# Patient Record
Sex: Male | Born: 1965 | Race: White | Marital: Married | Smoking: Never smoker
Health system: Southern US, Community
[De-identification: ages and names within clinical notes are randomized; demographics above are authoritative.]

## PROBLEM LIST (undated history)

## (undated) DIAGNOSIS — M109 Gout, unspecified: Secondary | ICD-10-CM

## (undated) HISTORY — DX: Gout, unspecified: M10.9

---

## 2012-05-25 HISTORY — PX: KNEE ARTHROSCOPY W/ MENISCECTOMY: SHX1879

## 2016-09-28 DIAGNOSIS — M109 Gout, unspecified: Secondary | ICD-10-CM | POA: Insufficient documentation

## 2017-04-04 NOTE — Progress Notes (Signed)
New patient establishment of care  Assessment and Plan:  Alexander Logan was seen today for establish care and gout.  Diagnoses and all orders for this visit:  Establishing care with new doctor, encounter for  Chronic gout involving toe of right foot without tophus, unspecified cause -     predniSONE (DELTASONE) 20 MG tablet; 2 tablets daily for 3 days, 1 tablet daily for 4 days. -     Uric acid  Medication management -     CBC with Differential/Platelet -     BASIC METABOLIC PANEL WITH GFR -     Hepatic function panel -     Urinalysis w microscopic + reflex cultur  Gastroesophageal reflux disease, esophagitis presence not specified       -      Discussed diet, avoiding triggers and other lifestyle changes  Need for influenza vaccination -     FLU VACCINE MDCK QUAD W/Preservative  Patient to obtain medical record from previous PCP and present at next visit for CPE.   Discussed med's effects and SE's. Screening labs and tests as requested with regular follow-up as recommended. Over 40 minutes of exam, counseling, chart review and critical decision making was performed  Future Appointments  Date Time Provider Department Center  10/08/2017  9:30 AM Judd Gaudierorbett, Leni Pankonin, NP [facility] None    HPI  BP 124/82   Pulse 82   Temp 97.7 F (36.5 C)   Ht 5\' 7"  (1.702 m)   Wt 186 lb 9.6 oz (84.6 kg)   SpO2 97%   BMI 29.23 kg/m   This very nice Caucasian 51 y.o.male presents for establishment of care. [place]. He is originally from this area. Patient is married to an Charity fundraiserN with twin grown 50 year old boys. 2 grandkids. 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 Patient has no major health issues other than intermittent gout of R toe which he currently is experiencing a flare- he was seen at an urgent care and prescribed prednisone which he completed 1 week ago, and is now experiencing severe pain again. He is not currently on allopurinol or colchicine and reports he  has not been in the past; has been treated with prednisone only for rare flares.  He also endorses frequent acid reflux with consumption of large meals or spicy foods.   Preventive Care He has been [place] in [place] and had a physical last year though unsure when.  He has had a colonoscopy this year which was reportedly "negative," he believes his last tetanus shot was last year. He has not had the flu vaccine this year. He declines STD testing today. He uses earplugs at work, wears sunglasses regularly for eye protection.   He does not smoke, does not drink alcohol. Never has used recreational drugs.   His last eye exam was last year- he wears glasses His last dental exam is remote; recommended a minimum of annual cleaning.  He has never been evaluated by dermatology; no concerning spots today.   BMI is Body mass index is 29.23 kg/m., he has not been working on diet and exercise.  Wt Readings from Last 3 Encounters:  04/05/17 186 lb 9.6 oz (84.6 kg)     Current Medications:  No current outpatient medications on file prior to visit.   No current facility-administered medications on file prior to visit.    Health Maintenance:   Immunization History  Administered Date(s) Administered  . Influenza Inj Mdck  Quad With Preservative 04/05/2017     Allergies: No Known Allergies Medical History:  has Gout on their problem list. Surgical History:  He  has a past surgical history that includes Knee arthroscopy w/ meniscectomy (Left, 2014). Family History:  Hisfamily history includes Diabetes in his father. Social History:   reports that  has never smoked. he has never used smokeless tobacco. He reports that he does not drink alcohol or use drugs.  Review of Systems: Review of Systems  Constitutional: Negative for malaise/fatigue and weight loss.  HENT: Negative for hearing loss and tinnitus.   Eyes: Negative for blurred vision and double vision.  Respiratory: Negative  for cough, shortness of breath and wheezing.   Cardiovascular: Negative for chest pain, palpitations, orthopnea, claudication and leg swelling.  Gastrointestinal: Negative for abdominal pain, blood in stool, constipation, diarrhea, heartburn, melena, nausea and vomiting.  Genitourinary: Negative.  Negative for dysuria, frequency and urgency.  Musculoskeletal: Negative for joint pain and myalgias.  Skin: Negative for rash.  Neurological: Negative for dizziness, tingling, sensory change, weakness and headaches.  Endo/Heme/Allergies: Negative for environmental allergies and polydipsia.  Psychiatric/Behavioral: Negative.  Negative for depression and substance abuse. The patient is not nervous/anxious and does not have insomnia.   All other systems reviewed and are negative.   Physical Exam: Estimated body mass index is 29.23 kg/m as calculated from the following:   Height as of this encounter: 5\' 7"  (1.702 m).   Weight as of this encounter: 186 lb 9.6 oz (84.6 kg). BP 124/82   Pulse 82   Temp 97.7 F (36.5 C)   Ht 5\' 7"  (1.702 m)   Wt 186 lb 9.6 oz (84.6 kg)   SpO2 97%   BMI 29.23 kg/m  General Appearance: Well nourished, in no apparent distress.  Eyes: PERRLA, EOMs, conjunctiva no swelling or erythema.  Sinuses: No Frontal/maxillary tenderness  ENT/Mouth: Ext aud canals clear, normal light reflex with TMs without erythema, bulging. Good dentition. No erythema, swelling, or exudate on post pharynx. Tonsils not swollen or erythematous. Hearing normal.  Neck: Supple, thyroid normal. No bruits  Respiratory: Respiratory effort normal, BS equal bilaterally without rales, rhonchi, wheezing or stridor.  Cardio: RRR without murmurs, rubs or gallops. Brisk peripheral pulses without edema.  Chest: symmetric, with normal excursions and percussion.  Abdomen: Soft, nontender, no guarding, rebound, hernias, masses, or organomegaly.  Lymphatics: Non tender without lymphadenopathy.  Genitourinary:  defer Musculoskeletal: Full ROM all peripheral extremities,5/5 strength, and normal gait.  Skin: Warm, dry without rashes, lesions, ecchymosis. Neuro: Cranial nerves intact, reflexes equal bilaterally. Normal muscle tone, no cerebellar symptoms. Sensation intact.  Psych: Awake and oriented X 3, normal affect, Insight and Judgment appropriate.   EKG: defer  Dan Makershley C Masato Pettie 3:44 PM [facility]

## 2017-04-05 ENCOUNTER — Encounter: Payer: Self-pay | Admitting: Adult Health

## 2017-04-05 ENCOUNTER — Ambulatory Visit: Payer: Managed Care, Other (non HMO) | Admitting: Adult Health

## 2017-04-05 VITALS — BP 124/82 | HR 82 | Temp 97.7°F | Ht 67.0 in | Wt 186.6 lb

## 2017-04-05 DIAGNOSIS — M1A9XX0 Chronic gout, unspecified, without tophus (tophi): Secondary | ICD-10-CM

## 2017-04-05 DIAGNOSIS — K219 Gastro-esophageal reflux disease without esophagitis: Secondary | ICD-10-CM

## 2017-04-05 DIAGNOSIS — Z7689 Persons encountering health services in other specified circumstances: Secondary | ICD-10-CM

## 2017-04-05 DIAGNOSIS — Z23 Encounter for immunization: Secondary | ICD-10-CM | POA: Diagnosis not present

## 2017-04-05 DIAGNOSIS — Z79899 Other long term (current) drug therapy: Secondary | ICD-10-CM

## 2017-04-05 MED ORDER — PREDNISONE 20 MG PO TABS: ORAL_TABLET | ORAL | 0 refills | Status: DC

## 2017-04-05 NOTE — Patient Instructions (Addendum)
Please reach out to your last PCP and obtain medical records - recent labs, colonoscopy records, immunizations, etc.  Drink 80-100+ fluid ounces daily of water.   We recommend a whole foods diet (low in processed foods) generous in vegetables/fruits/whole grains/legumes, modest healthy fats (olive oil/nuts/avocado) and minimal red meat/dairy fat.    Gout Gout is painful swelling that can occur in some of your joints. Gout is a type of arthritis. This condition is caused by having too much uric acid in your body. Uric acid is a chemical that forms when your body breaks down substances called purines. Purines are important for building body proteins. When your body has too much uric acid, sharp crystals can form and build up inside your joints. This causes pain and swelling. Gout attacks can happen quickly and be very painful (acute gout). Over time, the attacks can affect more joints and become more frequent (chronic gout). Gout can also cause uric acid to build up under your skin and inside your kidneys. What are the causes? This condition is caused by too much uric acid in your blood. This can occur because:  Your kidneys do not remove enough uric acid from your blood. This is the most common cause.  Your body makes too much uric acid. This can occur with some cancers and cancer treatments. It can also occur if your body is breaking down too many red blood cells (hemolytic anemia).  You eat too many foods that are high in purines. These foods include organ meats and some seafood. Alcohol, especially beer, is also high in purines.  A gout attack may be triggered by trauma or stress. What increases the risk? This condition is more likely to develop in people who:  Have a family history of gout.  Are male and middle-aged.  Are male and have gone through menopause.  Are obese.  Frequently drink alcohol, especially beer.  Are dehydrated.  Lose weight too quickly.  Have an organ  transplant.  Have lead poisoning.  Take certain medicines, including aspirin, cyclosporine, diuretics, levodopa, and niacin.  Have kidney disease or psoriasis.  What are the signs or symptoms? An attack of acute gout happens quickly. It usually occurs in just one joint. The most common place is the big toe. Attacks often start at night. Other joints that may be affected include joints of the feet, ankle, knee, fingers, wrist, or elbow. Symptoms may include:  Severe pain.  Warmth.  Swelling.  Stiffness.  Tenderness. The affected joint may be very painful to touch.  Shiny, red, or purple skin.  Chills and fever.  Chronic gout may cause symptoms more frequently. More joints may be involved. You may also have white or yellow lumps (tophi) on your hands or feet or in other areas near your joints. How is this diagnosed? This condition is diagnosed based on your symptoms, medical history, and physical exam. You may have tests, such as:  Blood tests to measure uric acid levels.  Removal of joint fluid with a needle (aspiration) to look for uric acid crystals.  X-rays to look for joint damage.  How is this treated? Treatment for this condition has two phases: treating an acute attack and preventing future attacks. Acute gout treatment may include medicines to reduce pain and swelling, including:  NSAIDs.  Steroids. These are strong anti-inflammatory medicines that can be taken by mouth (orally) or injected into a joint.  Colchicine. This medicine relieves pain and swelling when it is taken soon after an  attack. It can be given orally or through an IV tube.  Preventive treatment may include:  Daily use of smaller doses of NSAIDs or colchicine.  Use of a medicine that reduces uric acid levels in your blood.  Changes to your diet. You may need to see a specialist about healthy eating (dietitian).  Follow these instructions at home: During a Gout Attack  If directed, apply  ice to the affected area: ? Put ice in a plastic bag. ? Place a towel between your skin and the bag. ? Leave the ice on for 20 minutes, 2-3 times a day.  Rest the joint as much as possible. If the affected joint is in your leg, you may be given crutches to use.  Raise (elevate) the affected joint above the level of your heart as often as possible.  Drink enough fluids to keep your urine clear or pale yellow.  Take over-the-counter and prescription medicines only as told by your health care provider.  Do not drive or operate heavy machinery while taking prescription pain medicine.  Follow instructions from your health care provider about eating or drinking restrictions.  Return to your normal activities as told by your health care provider. Ask your health care provider what activities are safe for you. Avoiding Future Gout Attacks  Follow a low-purine diet as told by your dietitian or health care provider. Avoid foods and drinks that are high in purines, including liver, kidney, anchovies, asparagus, herring, mushrooms, mussels, and beer.  Limit alcohol intake to no more than 1 drink a day for nonpregnant women and 2 drinks a day for men. One drink equals 12 oz of beer, 5 oz of wine, or 1 oz of hard liquor.  Maintain a healthy weight or lose weight if you are overweight. If you want to lose weight, talk with your health care provider. It is important that you do not lose weight too quickly.  Start or maintain an exercise program as told by your health care provider.  Drink enough fluids to keep your urine clear or pale yellow.  Take over-the-counter and prescription medicines only as told by your health care provider.  Keep all follow-up visits as told by your health care provider. This is important. Contact a health care provider if:  You have another gout attack.  You continue to have symptoms of a gout attack after10 days of treatment.  You have side effects from your  medicines.  You have chills or a fever.  You have burning pain when you urinate.  You have pain in your lower back or belly. Get help right away if:  You have severe or uncontrolled pain.  You cannot urinate. This information is not intended to replace advice given to you by your health care provider. Make sure you discuss any questions you have with your health care provider. Document Released: 05/08/2000 Document Revised: 10/17/2015 Document Reviewed: 02/21/2015 Elsevier Interactive Patient Education  2017 [employer]

## 2017-04-06 ENCOUNTER — Other Ambulatory Visit: Payer: Self-pay | Admitting: Adult Health

## 2017-04-06 LAB — BASIC METABOLIC PANEL WITH GFR
BUN: 18 mg/dL (ref 7–25)
CALCIUM: 9.8 mg/dL (ref 8.6–10.3)
CO2: 28 mmol/L (ref 20–32)
CREATININE: 1.1 mg/dL (ref 0.70–1.33)
Chloride: 103 mmol/L (ref 98–110)
GFR, EST AFRICAN AMERICAN: 90 mL/min/{1.73_m2} (ref >=60)
GFR, EST NON AFRICAN AMERICAN: 78 mL/min/{1.73_m2} (ref >=60)
Glucose, Bld: 82 mg/dL (ref 65–99)
POTASSIUM: 5 mmol/L (ref 3.5–5.3)
SODIUM: 139 mmol/L (ref 135–146)

## 2017-04-06 LAB — URINALYSIS W MICROSCOPIC + REFLEX CULTURE
BILIRUBIN URINE: NEGATIVE
Bacteria, UA: NONE SEEN /HPF
GLUCOSE, UA: NEGATIVE
Hgb urine dipstick: NEGATIVE
Hyaline Cast: NONE SEEN /LPF
KETONES UR: NEGATIVE
LEUKOCYTE ESTERASE: NEGATIVE
NITRITES URINE, INITIAL: NEGATIVE
PH: 6.5 (ref 5.0–8.0)
Protein, ur: NEGATIVE
RBC / HPF: NONE SEEN /HPF (ref 0–2)
SPECIFIC GRAVITY, URINE: 1.018 (ref 1.001–1.03)
Squamous Epithelial / LPF: NONE SEEN /HPF (ref <=5)
WBC UA: NONE SEEN /HPF (ref 0–5)

## 2017-04-06 LAB — CBC WITH DIFFERENTIAL/PLATELET
BASOS PCT: 0.3 %
Basophils Absolute: 24 cells/uL (ref 0–200)
EOS ABS: 0 {cells}/uL — AB (ref 15–500)
EOS PCT: 0 %
HCT: 47.8 % (ref 38.5–50.0)
Hemoglobin: 16.9 g/dL (ref 13.2–17.1)
Lymphs Abs: 1422 cells/uL (ref 850–3900)
MCH: 31.4 pg (ref 27.0–33.0)
MCHC: 35.4 g/dL (ref 32.0–36.0)
MCV: 88.7 fL (ref 80.0–100.0)
MONOS PCT: 4.8 %
MPV: 10.2 fL (ref 7.5–12.5)
NEUTROS ABS: 6075 {cells}/uL (ref 1500–7800)
Neutrophils Relative %: 76.9 %
PLATELETS: 275 10*3/uL (ref 140–400)
RBC: 5.39 10*6/uL (ref 4.20–5.80)
RDW: 12.1 % (ref 11.0–15.0)
TOTAL LYMPHOCYTE: 18 %
WBC mixed population: 379 cells/uL (ref 200–950)
WBC: 7.9 10*3/uL (ref 3.8–10.8)

## 2017-04-06 LAB — HEPATIC FUNCTION PANEL
AG Ratio: 1.7 (calc) (ref 1.0–2.5)
ALBUMIN MSPROF: 4.7 g/dL (ref 3.6–5.1)
ALT: 25 U/L (ref 9–46)
AST: 13 U/L (ref 10–35)
Alkaline phosphatase (APISO): 53 U/L (ref 40–115)
Bilirubin, Direct: 0.1 mg/dL (ref 0.0–0.2)
Globulin: 2.8 g/dL (calc) (ref 1.9–3.7)
Indirect Bilirubin: 0.5 mg/dL (calc) (ref 0.2–1.2)
Total Bilirubin: 0.6 mg/dL (ref 0.2–1.2)
Total Protein: 7.5 g/dL (ref 6.1–8.1)

## 2017-04-06 LAB — URIC ACID: URIC ACID, SERUM: 6.4 mg/dL (ref 4.0–8.0)

## 2017-04-06 LAB — NO CULTURE INDICATED

## 2017-04-06 MED ORDER — COLCHICINE 0.6 MG PO TABS: ORAL_TABLET | ORAL | 2 refills | Status: DC

## 2017-04-08 ENCOUNTER — Telehealth: Payer: Self-pay | Admitting: Adult Health

## 2017-04-08 DIAGNOSIS — M1A0710 Idiopathic chronic gout, right ankle and foot, without tophus (tophi): Secondary | ICD-10-CM

## 2017-04-08 MED ORDER — ALLOPURINOL 100 MG PO TABS: 100.0000 mg | ORAL_TABLET | Freq: Every day | ORAL | 2 refills | Status: DC

## 2017-04-08 NOTE — Telephone Encounter (Signed)
Patient started colchicine for gout attack; reporting diarrhea with medication. Unclear how he took the medication - will call back to clarify, may need to take reduced dose. Results of elevated uric acid levels received from previous PCP; in light of recent recurrent attacks, will initiate allopurinol therapy 2-3 weeks after current flare resolves.

## 2017-04-09 NOTE — Telephone Encounter (Signed)
Patient notified, feeling better. Understood instructions. No other questions or concerns at this time.

## 2017-05-20 ENCOUNTER — Encounter: Payer: Self-pay | Admitting: Internal Medicine

## 2017-06-09 NOTE — Progress Notes (Signed)
Assessment and Plan:  Alexander Logan was seen today for cough, headache and fever.  Diagnoses and all orders for this visit:  Acute bronchitis, unspecified organism -     azithromycin (ZITHROMAX) 250 MG tablet; Take 2 tablets (500 mg) on  Day 1,  followed by 1 tablet (250 mg) once daily on Days 2 through 5. -     predniSONE (DELTASONE) 20 MG tablet; 2 tablets daily for 3 days, 1 tablet daily for 4 days. -     promethazine-codeine (PHENERGAN WITH CODEINE) 6.25-10 MG/5ML syrup; Take 5 mLs by mouth every 6 (six) hours as needed for cough. Max: 20mL per day -     benzonatate (TESSALON PERLES) 100 MG capsule; Take 2 capsules (200 mg total) by mouth 3 (three) times daily as needed for cough (Max: 600mg  per day). Follow up as needed.   Go to the ER if any chest pain, shortness of breath, nausea, dizziness, severe HA, changes vision/speech  Further disposition pending results of labs. Discussed med's effects and SE's.   Over 15 minutes of exam, counseling, chart review, and critical decision making was performed.   Future Appointments  Date Time Provider Department Center  10/08/2017  9:30 AM Judd Gaudier, NP [facility] None    ------------------------------------------------------------------------------------------------------------------   HPI BP 130/86   Pulse (!) 102   Temp 98.8 F (37.1 C)   Resp 18   Ht 5\' 7"  (1.702 m)   Wt 185 lb (83.9 kg)   SpO2 96%   BMI 28.98 kg/m   51 y.o.male presents for cough, sore throat, hacking chest congestion, headaches intermittently after coughing, back pain (after coughing) ongoing for 3 weeks. He reports he had a temp of 100.0 Farenheit at home. Has been taking mucinex D and sudafed. Denies chest pain, palpitations,dyspnea, n/v, extremity pain, dizziness, vision changes, rashes, myalgias/arthralgia, fatigue/malaise.   Past Medical History:  Diagnosis Date  . Gout      No Known Allergies  Current Outpatient Medications on File Prior to Visit   Medication Sig  . allopurinol (ZYLOPRIM) 100 MG tablet Take 1 tablet (100 mg total) daily by mouth.  . colchicine 0.6 MG tablet Take 2 tabs with fist sign of gout flare, then 1 tab daily.   No current facility-administered medications on file prior to visit.     ROS: all negative except above.   Physical Exam:  BP 130/86   Pulse (!) 102   Temp 98.8 F (37.1 C)   Resp 18   Ht 5\' 7"  (1.702 m)   Wt 185 lb (83.9 kg)   SpO2 96%   BMI 28.98 kg/m   General Appearance: Well nourished, appears ill, in no acute distress. Eyes: PERRLA, EOMs, conjunctiva no swelling or erythema Sinuses: No Frontal/maxillary tenderness ENT/Mouth: Ext aud canals clear, TMs without erythema, bulging. No erythema, swelling, or exudate on post pharynx.  Tonsils not swollen or erythematous. Hearing normal.  Neck: Supple, thyroid normal.  Respiratory: Respiratory effort normal, BS equal bilaterally with mild expiratory coarse wheezing to bilateral bases without rales, rhonchi or stridor.  Cardio: RRR with no MRGs. Brisk peripheral pulses without edema.  Abdomen: Soft, + BS.  Non tender, no guarding, rebound, hernias, masses. Lymphatics: Non tender without lymphadenopathy.  Musculoskeletal: Full ROM, 5/5 strength, normal gait.  Skin: Warm, dry, flushed,  without rashes, lesions, ecchymosis.  Neuro: Cranial nerves intact. Normal muscle tone, no cerebellar symptoms. Sensation intact.  Psych: Awake and oriented X 3, normal affect, Insight and Judgment appropriate.  Dan MakerAshley C Chasady Longwell, NP 7:07 PM [facility]

## 2017-06-10 ENCOUNTER — Ambulatory Visit: Payer: Managed Care, Other (non HMO) | Admitting: Adult Health

## 2017-06-10 ENCOUNTER — Encounter: Payer: Self-pay | Admitting: Adult Health

## 2017-06-10 VITALS — BP 130/86 | HR 102 | Temp 98.8°F | Resp 18 | Ht 67.0 in | Wt 185.0 lb

## 2017-06-10 DIAGNOSIS — J209 Acute bronchitis, unspecified: Secondary | ICD-10-CM | POA: Diagnosis not present

## 2017-06-10 MED ORDER — PREDNISONE 20 MG PO TABS: ORAL_TABLET | ORAL | 0 refills | Status: DC

## 2017-06-10 MED ORDER — AZITHROMYCIN 250 MG PO TABS: ORAL_TABLET | ORAL | 1 refills | Status: AC

## 2017-06-10 MED ORDER — PROMETHAZINE-CODEINE 6.25-10 MG/5ML PO SYRP: 5.0000 mL | ORAL_SOLUTION | Freq: Four times a day (QID) | ORAL | 0 refills | Status: DC | PRN

## 2017-06-10 MED ORDER — BENZONATATE 100 MG PO CAPS: 200.0000 mg | ORAL_CAPSULE | Freq: Three times a day (TID) | ORAL | 0 refills | Status: DC | PRN

## 2017-06-10 NOTE — Patient Instructions (Signed)

## 2017-07-06 ENCOUNTER — Other Ambulatory Visit: Payer: Self-pay | Admitting: Adult Health

## 2017-07-06 DIAGNOSIS — M1A0710 Idiopathic chronic gout, right ankle and foot, without tophus (tophi): Secondary | ICD-10-CM

## 2017-08-26 ENCOUNTER — Ambulatory Visit: Payer: Managed Care, Other (non HMO) | Admitting: Physician Assistant

## 2017-08-26 ENCOUNTER — Encounter: Payer: Self-pay | Admitting: Physician Assistant

## 2017-08-26 VITALS — BP 134/88 | HR 98 | Temp 97.7°F | Resp 16 | Ht 67.0 in | Wt 188.6 lb

## 2017-08-26 DIAGNOSIS — M109 Gout, unspecified: Secondary | ICD-10-CM | POA: Diagnosis not present

## 2017-08-26 MED ORDER — PREDNISONE 20 MG PO TABS: ORAL_TABLET | ORAL | 1 refills | Status: DC

## 2017-08-26 MED ORDER — DEXAMETHASONE SODIUM PHOSPHATE 100 MG/10ML IJ SOLN: 10.0000 mg | Freq: Once | INTRAMUSCULAR | Status: DC

## 2017-08-26 NOTE — Progress Notes (Signed)
   Subjective:    Patient ID: Alexander Logan, male    DOB: 10/03/1965, 52 y.o.   MRN: [id number]  HPI 52 y.o. WM with history of gout presents with gout flare x Sunday. Feels like previous gout flares, started Sunday, painful, red, swollen left MCP joint. No fever, chills for the patient. He is unable to take colchicine due to diarrhea. He is on allopurinol but has not been taking it regularly, admits to poor diet.    Lab Results  Component Value Date   LABURIC 6.4 04/05/2017     Blood pressure 134/88, pulse 98, temperature 97.7 F (36.5 C), resp. rate 16, height 5\' 7"  (1.702 m), weight 188 lb 9.6 oz (85.5 kg), SpO2 98 %.  Medications Current Outpatient Medications on File Prior to Visit  Medication Sig  . allopurinol (ZYLOPRIM) 100 MG tablet TAKE 1 TABLET (100 MG TOTAL) DAILY BY MOUTH.   No current facility-administered medications on file prior to visit.     Problem list He has Gout on their problem list.  Review of Systems See HPI    Objective:   Physical Exam  Constitutional: He is oriented to person, place, and time. He appears well-developed and well-nourished.  HENT:  Head: Normocephalic and atraumatic.  Right Ear: External ear normal.  Left Ear: External ear normal.  Eyes: Pupils are equal, round, and reactive to light. Conjunctivae are normal.  Neck: Normal range of motion. Neck supple. No thyromegaly present.  Cardiovascular: Normal rate and regular rhythm.  No murmur heard. Pulmonary/Chest: Effort normal and breath sounds normal. No respiratory distress. He has no wheezes.  Abdominal: Soft. Bowel sounds are normal. There is no tenderness.  Musculoskeletal:  Left MTP with warmth, tenderness, erythema, no streaking, no edema, and normal distal neurovascular exam.   Lymphadenopathy:    He has no cervical adenopathy.  Neurological: He is alert and oriented to person, place, and time. He has normal reflexes.  Skin: Skin is warm and dry. No rash noted.        Assessment & Plan:    Acute gout involving toe of left foot, unspecified cause -     dexamethasone (DECADRON) injection 10 mg -     predniSONE (DELTASONE) 20 MG tablet; 2 tablets daily for 3 days, 1 tablet daily for 4 days. prednisone taper,  Diet discussed, decrease/stop ETOH - counseled do NOT start and stop allopurinol

## 2017-08-26 NOTE — Patient Instructions (Signed)
Information for patients with Gout   Do NOT STOP the allopurinol   Gout defined-Gout occurs when urate crystals accumulate in your joint causing the inflammation and intense pain of gout attack.  Urate crystals can form when you have high levels of uric acid in your blood.  Your body produces uric acid when it breaks down prurines-substances that are found naturally in your body, as well as in certain foods such as organ meats, anchioves, herring, asparagus, and mushrooms.  Normally uric acid dissolves in your blood and passes through your kidneys into your urine.  But sometimes your body either produces too much uric acid or your kidneys excrete too little uric acid.  When this happens, uric acid can build up, forming sharp needle-like urate crystals in a joint or surrounding tissue that cause pain, inflammation and swelling.    Gout is characterized by sudden, severe attacks of pain, redness and tenderness in joints, often the joint at the base of the big toe.  Gout is complex form of arthritis that can affect anyone.  Men are more likely to get gout but women become increasingly more susceptible to gout after menopause.  An acute attack of gout can wake you up in the middle of the night with the sensation that your big toe is on fire.  The affected joint is hot, swollen and so tender that even the weight or the sheet on it may seem intolerable.  If you experience symptoms of an acute gout attack it is important to your doctor as soon as the symptoms start.  Gout that goes untreated can lead to worsening pain and joint damage.  Risk Factors:  You are more likely to develop gout if you have high levels of uric acid in your body.    Factors that increase the uric acid level in your body include:  Lifestyle factors.  Excessive alcohol use-generally more than two drinks a day for men and more than one for women increase the risk of gout.  Medical conditions.  Certain conditions make it more  likely that you will develop gout.  These include hypertension, and chronic conditions such as diabetes, high levels of fat and cholesterol in the blood, and narrowing of the arteries.  Certain medications.  The uses of Thiazide diuretics- commonly used to treat hypertension and low dose aspirin can also increase uric acid levels.  Family history of gout.  If other members of your family have had gout, you are more likely to develop the disease.  Age and sex. Gout occurs more often in men than it does in women, primarily because women tend to have lower uric acid levels than men do.  Men are more likely to develop gout earlier usually between the ages of 3140-50- whereas women generally develop signs and symptoms after menopause.    Tests and diagnosis:  Tests to help diagnose gout may include:  Blood test.  Your doctor may recommend a blood test to measure the uric acid level in your blood .  Blood tests can be misleading, though.  Some people have high uric acid levels but never experience gout.  And some people have signs and symptoms of gout, but don't have unusual levels of uric acid in their blood.  Joint fluid test.  Your doctor may use a needle to draw fluid from your affected joint.  When examined under the microscope, your joint fluid may reveal urate crystals.  Treatment:  Treatment for gout usually involves medications.  What  medications you and your doctor choose will be based on your current health and other medications you currently take.  Gout medications can be used to treat acute gout attacks and prevent future attacks as well as reduce your risk of complications from gout such as the development of tophi from urate crystal deposits.  Alternative medicine:   Certain foods have been studied for their potential to lower uric acid levels, including:  Coffee.  Studies have found an association between coffee drinking (regular and decaf) and lower uric acid levels.  The evidence  is not enough to encourage non-coffee drinkers to start, but it may give clues to new ways of treating gout in the future.  Vitamin C.  Supplements containing vitamin C may reduce the levels of uric acid in your blood.  However, vitamin as a treatment for gout. Don't assume that if a little vitamin C is good, than lots is better.  Megadoses of vitamin C may increase your bodies uric acid levels.  Cherries.  Cherries have been associated with lower levels of uric acid in studies, but it isn't clear if they have any effect on gout signs and symptoms.  Eating more cherries and other dar-colored fruits, such as blackberries, blueberries, purple grapes and raspberries, may be a safe way to support your gout treatment.    Lifestyle/Diet Recommendations:   Drink 8 to 16 cups ( about 2 to 4 liters) of fluid each day, with at least half being water.  Avoid alcohol  Eat a moderate amount of protein, preferably from healthy sources, such as low-fat or fat-free dairy, tofu, eggs, and nut butters.  Limit you daily intake of meat, fish, and poultry to 4 to 6 ounces.  Avoid high fat meats and desserts.  Decrease you intake of shellfish, beef, lamb, pork, eggs and cheese.  Choose a good source of vitamin C daily such as citrus fruits, strawberries, broccoli,  brussel sprouts, papaya, and cantaloupe.   Choose a good source of vitamin A every other day such as yellow fruits, or dark green/yellow vegetables.  Avoid drastic weigh reduction or fasting.  If weigh loss is desired lose it over a period of several months.  See "dietary considerations.." chart for specific food recommendations.  Dietary Considerations for people with Gout  Food with negligible purine content (0-15 mg of purine nitrogen per 100 grams food)  May use as desired except on calorie variations  Non fat milk Cocoa Cereals (except in list II) Hard candies  Buttermilk Carbonated drinks Vegetables (except in list II) Sherbet  Coffee  Fruits Sugar Honey  Tea Cottage Cheese Gelatin-jell-o Salt  Fruit juice Breads Angel food Cake   Herbs/spices Jams/Jellies [employer]    Foods that do not contain excessive purine content, but must be limited due to fat content  Cream Eggs Oil and Salad Dressing  Half and Half Peanut Butter Chocolate  Whole Milk Cakes Potato Chips  Butter Ice Cream Fried Foods  Cheese Nuts Waffles, pancakes   List II: Food with moderate purine content (50-150 mg of purine nitrogen per 100 grams of food)  Limit total amount each day to 5 oz. cooked Lean meat, other than those on list III   Poultry, other than those on list III Fish, other than those on list III   Seafood, other than those on list III  These foods may be used occasionally  Peas Lentils Bran  Spinach Oatmeal Dried Beans and Peas  Asparagus Wheat Germ Mushrooms  Additional information about meat choices  Choose fish and poultry, particularly without skin, often.  Select lean, well trimmed cuts of meat.  Avoid all fatty meats, bacon , sausage, fried meats, fried fish, or poultry, luncheon meats, cold cuts, hot dogs, meats canned or frozen in gravy, spareribs and frozen and packaged prepared meats.   List III: Foods with HIGH purine content / Foods to AVOID (150-800 mg of purine nitrogen per 100 grams of food)  Anchovies Herring Meat Broths  Liver Mackerel Meat Extracts  Kidney Scallops Meat Drippings  Sardines Wild Game Mincemeat  Sweetbreads Goose Gravy  Heart Tongue Yeast, baker's and brewers   Commercial soups made with any of the foods listed in List II or List III  In addition avoid all alcoholic beverages

## 2017-10-08 ENCOUNTER — Encounter: Payer: Self-pay | Admitting: Adult Health

## 2017-11-23 ENCOUNTER — Encounter: Payer: Self-pay | Admitting: Adult Health

## 2018-02-02 ENCOUNTER — Encounter: Payer: Self-pay | Admitting: Physician Assistant

## 2018-02-02 ENCOUNTER — Ambulatory Visit: Payer: Managed Care, Other (non HMO) | Admitting: Physician Assistant

## 2018-02-02 VITALS — BP 120/98 | HR 88 | Temp 98.3°F | Resp 16 | Wt 183.4 lb

## 2018-02-02 DIAGNOSIS — M7661 Achilles tendinitis, right leg: Secondary | ICD-10-CM

## 2018-02-02 MED ORDER — PREDNISONE 20 MG PO TABS
ORAL_TABLET | ORAL | 0 refills | Status: AC
Start: 2018-02-02 — End: 2018-02-13

## 2018-02-02 NOTE — Patient Instructions (Addendum)
Follow up with ortho- can call to see if they can get you in You can walk in Saturday 9-2 or Sunday 10-2 With [facility] [address] [phone]   Ice, rest, wear the night time orthodic and wear the boot  Please take the prednisone to help decrease inflammation and therefore decrease symptoms. Take it it with food to avoid GI upset. It can cause increased energy but on the other hand it can make it hard to sleep at night so please take it AT NIGHT WITH DINNER, it takes 8-12 hours to start working so it will NOT affect your sleeping if you take it at night with your food!!  If you are diabetic it will increase your sugars so decrease carbs and monitor your sugars closely.      Achilles Tendinitis Achilles tendinitis is inflammation of the tough, cord-like band that attaches the lower leg muscles to the heel bone (Achilles tendon). This is usually caused by overusing the tendon and the ankle joint. Achilles tendinitis usually gets better over time with treatment and caring for yourself at home. It can take weeks or months to heal completely. What are the causes? This condition may be caused by:  A sudden increase in exercise or activity, such as running.  Doing the same exercises or activities (such as jumping) over and over.  Not warming up calf muscles before exercising.  Exercising in shoes that are worn out or not made for exercise.  Having arthritis or a bone growth (spur) on the back of the heel bone. This can rub against the tendon and hurt it.  Age-related wear and tear. Tendons become less flexible with age and more likely to be injured.  What are the signs or symptoms? Common symptoms of this condition include:  Pain in the Achilles tendon or in the back of the leg, just above the heel. The pain usually gets worse with exercise.  Stiffness or soreness in the back of the leg, especially in the morning.  Swelling of the skin over the  Achilles tendon.  Thickening of the tendon.  Bone spurs at the bottom of the Achilles tendon, near the heel.  Trouble standing on tiptoe.  How is this diagnosed? This condition is diagnosed based on your symptoms and a physical exam. You may have tests, including:  X-rays.  MRI.  How is this treated? The goal of treatment is to relieve symptoms and help your injury heal. Treatment may include:  Decreasing or stopping activities that caused the tendinitis. This may mean switching to low-impact exercises like biking or swimming.  Icing the injured area.  Doing physical therapy, including strengthening and stretching exercises.  NSAIDs to help relieve pain and swelling.  Using supportive shoes, wraps, heel lifts, or a walking boot (air cast).  Surgery. This may be done if your symptoms do not improve after 6 months.  Using high-energy shock wave impulses to stimulate the healing process (extracorporeal shock wave therapy). This is rare.  Injection of medicines to help relieve inflammation (corticosteroids). This is rare.  Follow these instructions at home: If you have an air cast:  Wear the cast as told by your health care provider. Remove it only as told by your health care provider.  Loosen the cast if your toes tingle, become numb, or turn cold and blue. Activity  Gradually return to your normal activities once your health care provider approves. Do not do activities that cause pain. ? Consider doing low-impact exercises,  like cycling or swimming.  If you have an air cast, ask your health care provider when it is safe for you to drive.  If physical therapy was prescribed, do exercises as told by your health care provider or physical therapist. Managing pain, stiffness, and swelling  Raise (elevate) your foot above the level of your heart while you are sitting or lying down.  Move your toes often to avoid stiffness and to lessen swelling.  If directed, put ice on  the injured area: ? Put ice in a plastic bag. ? Place a towel between your skin and the bag. ? Leave the ice on for 20 minutes, 2-3 times a day General instructions  If directed, wrap your foot with an elastic bandage or other wrap. This can help keep your tendon from moving too much while it heals. Your health care provider will show you how to wrap your foot correctly.  Wear supportive shoes or heel lifts only as told by your health care provider.  Take over-the-counter and prescription medicines only as told by your health care provider.  Keep all follow-up visits as told by your health care provider. This is important. Contact a health care provider if:  You have symptoms that gets worse.  You have pain that does not get better with medicine.  You develop new, unexplained symptoms.  You develop warmth and swelling in your foot.  You have a fever. Get help right away if:  You have a sudden popping sound or sensation in your Achilles tendon followed by severe pain.  You cannot move your toes or foot.  You cannot put any weight on your foot. Summary  Achilles tendinitis is inflammation of the tough, cord-like band that attaches the lower leg muscles to the heel bone (Achilles tendon).  This condition is usually caused by overusing the tendon and the ankle joint. It can also be caused by arthritis or normal aging.  The most common symptoms of this condition include pain, swelling, or stiffness in the Achilles tendon or in the back of the leg.  This condition is usually treated with rest, NSAIDs, and physical therapy. This information is not intended to replace advice given to you by your health care provider. Make sure you discuss any questions you have with your health care provider. Document Released: 02/18/2005 Document Revised: 03/30/2016 Document Reviewed: 03/30/2016 Elsevier Interactive Patient Education  2017 [employer].

## 2018-02-02 NOTE — Progress Notes (Signed)
   Subjective:    Patient ID: Alexander Logan, male    DOB: 18-Feb-1966, 52 y.o.   MRN: [id number]  HPI 52 y.o. WM presents for right foot pain. He states that 7 years ago patient had bone spur diagnosed by MRI, had injection and boot that improved it. States that most recently has had pain x Monday afternoon. He has pain right achilles tendon, no heel pain. Hurts worse with walking, but hurts all the time. Worse with standing first thing in the AM and in the middle of the night. 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   Blood pressure (!) 120/98, pulse 88, temperature 98.3 F (36.8 C), resp. rate 16, weight 183 lb 6.4 oz (83.2 kg), SpO2 98 %.  Medications Current Outpatient Medications on File Prior to Visit  Medication Sig  . allopurinol (ZYLOPRIM) 100 MG tablet TAKE 1 TABLET (100 MG TOTAL) DAILY BY MOUTH.   Current Facility-Administered Medications on File Prior to Visit  Medication  . dexamethasone (DECADRON) injection 10 mg    Problem list He has Gout on their problem list.   Review of Systems See HPI    Objective:   Physical Exam  Constitutional: He appears well-developed and well-nourished. No distress.  Musculoskeletal:  Right Foot exam reveals minimal point tenderness over achilles tendon insertion with palpable tender nodule, the inferior aspect of right heel with some tenderness, no deformity, redness, warmth.   The rest of the foot and ankle exam is normal. Color and temperature of the feet is normal. Peripheral pulses are normal.         Assessment & Plan:    Achilles tendinitis of right lower extremity - patient instructed to take prednisone, rest, ice, and wear walking boot x 2 weeks - RX for short air walking boot written, also get night time orthotic - follow up with ortho -     predniSONE (DELTASONE) 20 MG tablet; 3 tablets daily with food for 3 days, 2 tabs daily for 3 days, 1 tab a day for 5 days.   The patient was advised to  call immediately if he has any concerning symptoms in the interval. The patient voices understanding of current treatment options and is in agreement with the current care plan.The patient knows to call the clinic with any problems, questions or concerns or go to the ER if any further progression of symptoms.

## 2018-07-22 ENCOUNTER — Other Ambulatory Visit: Payer: Self-pay | Admitting: Adult Health

## 2018-07-22 DIAGNOSIS — M1A0710 Idiopathic chronic gout, right ankle and foot, without tophus (tophi): Secondary | ICD-10-CM

## 2018-10-14 ENCOUNTER — Encounter: Payer: Self-pay | Admitting: Adult Health

## 2018-11-29 ENCOUNTER — Encounter: Payer: Self-pay | Admitting: Adult Health

## 2018-12-05 ENCOUNTER — Other Ambulatory Visit: Payer: Self-pay

## 2018-12-05 ENCOUNTER — Encounter: Payer: Self-pay | Admitting: Adult Health

## 2018-12-05 ENCOUNTER — Ambulatory Visit: Payer: Managed Care, Other (non HMO) | Admitting: Adult Health

## 2018-12-05 DIAGNOSIS — J302 Other seasonal allergic rhinitis: Secondary | ICD-10-CM | POA: Diagnosis not present

## 2018-12-05 DIAGNOSIS — J069 Acute upper respiratory infection, unspecified: Secondary | ICD-10-CM | POA: Diagnosis not present

## 2018-12-05 MED ORDER — PROMETHAZINE-DM 6.25-15 MG/5ML PO SYRP: 5.0000 mL | ORAL_SOLUTION | Freq: Four times a day (QID) | ORAL | 1 refills | Status: DC | PRN

## 2018-12-05 MED ORDER — PREDNISONE 20 MG PO TABS: ORAL_TABLET | ORAL | 0 refills | Status: DC

## 2018-12-05 NOTE — Progress Notes (Signed)
Virtual Visit via Telephone Note  I connected with Alexander Logan on 12/05/18 at  4:15 PM EDT by telephone and verified that I am speaking with the correct person using two identifiers.  Location: Patient: home Provider: [place] office    I discussed the limitations, risks, security and privacy concerns of performing an evaluation and management service by telephone and the availability of in person appointments. I also discussed with the patient that there may be a patient responsible charge related to this service. The patient expressed understanding and agreed to proceed.  History of Present Illness:  There were no vitals taken for this visit.   53 y.o. male, works as Associate Professor in Architect, is wearing mask and following social distancing guidelines, has been avoiding going out otherwise, with hx of allergies woke up this AM with mild sore throat and headache (posterior), denies neck pain, denies nasal congestion, runny nose. He does endorse sense of post-nasal drip; mild dry cough. He denies fatigue, CP, wheezing, blurry vision, dizziness. Denies changes in taste/smell, n/v/d. He endorses mild sense of dyspnea, though states this is common for him with URI. Denies smoking hx, asthma, COPD or other notable pulmonary or cardiac history  He is taking motrin for headache which is helpful  He typically takes zyrtec PRN allergies, admits hasn't been taking recently   He reports typically does get a summer cold around this time, feels symptoms are typical for him. He didn't go to work today due to policy of no attendance with any possible covid 19 sx  No travel, no known sick contacts, no family member    Observations/Objective:  General : Well sounding patient in no apparent distress HEENT: no hoarseness, no cough for duration of visit Lungs: speaks in complete sentences, no audible wheezing, no apparent distress Neurological: alert, oriented x 3 Psychiatric: pleasant, judgement  appropriate    Assessment and Plan:  Alexander Logan was seen today for follow-up.  Diagnoses and all orders for this visit:  URI with cough and congestion URI/allergies versus COVID Currently mild symptoms, and low risk patient.  Suggested symptomatic OTC remedies. Nasal saline spray for congestion. Nasal steroids, allergy pill, flonase/astalin nasal spray, staying hydrated Follow up as needed via mychart or phone call with any new sx/changes Will do self isolation at home, recommended he arrange for drive through covid 19 testing via [facility] Advised not to return to work until negative covid or 3 days fever free without antipyretic, with improvement/resolved URI sx -     predniSONE (DELTASONE) 20 MG tablet; 2 tablets daily for 3 days, 1 tablet daily for 4 days. -     promethazine-dextromethorphan (PROMETHAZINE-DM) 6.25-15 MG/5ML syrup; Take 5 mLs by mouth 4 (four) times daily as needed for cough.   Follow Up Instructions:    I discussed the assessment and treatment plan with the patient. The patient was provided an opportunity to ask questions and all were answered. The patient agreed with the plan and demonstrated an understanding of the instructions.   The patient was advised to call back or seek an in-person evaluation if the symptoms worsen or if the condition fails to improve as anticipated.  I provided 15 minutes of non-face-to-face time during this encounter.   Izora Ribas, NP

## 2018-12-15 ENCOUNTER — Other Ambulatory Visit: Payer: Self-pay | Admitting: Physician Assistant

## 2018-12-15 DIAGNOSIS — J069 Acute upper respiratory infection, unspecified: Secondary | ICD-10-CM

## 2018-12-15 DIAGNOSIS — M109 Gout, unspecified: Secondary | ICD-10-CM

## 2018-12-15 MED ORDER — PREDNISONE 20 MG PO TABS: ORAL_TABLET | ORAL | 0 refills | Status: DC

## 2018-12-15 MED ORDER — COLCHICINE 0.6 MG PO TABS: 0.6000 mg | ORAL_TABLET | Freq: Two times a day (BID) | ORAL | 2 refills | Status: DC

## 2018-12-15 NOTE — Progress Notes (Signed)
Patient has been made aware of meds sent. Patient states he will call back to schedule a follow up appt.

## 2018-12-15 NOTE — Progress Notes (Unsigned)
Future Appointments  Date Time Provider [place]  03/09/2019  3:00 PM Liane Comber, NP [facility] None

## 2019-01-23 ENCOUNTER — Other Ambulatory Visit: Payer: Self-pay | Admitting: Adult Health

## 2019-01-23 ENCOUNTER — Other Ambulatory Visit: Payer: Self-pay | Admitting: Physician Assistant

## 2019-01-23 ENCOUNTER — Telehealth: Payer: Self-pay

## 2019-01-23 DIAGNOSIS — M109 Gout, unspecified: Secondary | ICD-10-CM

## 2019-01-23 DIAGNOSIS — M1A0710 Idiopathic chronic gout, right ankle and foot, without tophus (tophi): Secondary | ICD-10-CM

## 2019-01-23 MED ORDER — PREDNISONE 20 MG PO TABS: ORAL_TABLET | ORAL | 0 refills | Status: DC

## 2019-01-23 MED ORDER — ALLOPURINOL 300 MG PO TABS: 300.0000 mg | ORAL_TABLET | Freq: Every day | ORAL | 0 refills | Status: DC

## 2019-01-23 NOTE — Telephone Encounter (Signed)
Patient informed. He won't be able to come into the office for the next few weeks due to being out of town for work. His next scheduled appointment in in October. Do you want him to come in towards the end of September instead or can he wait until his October appointment?

## 2019-01-23 NOTE — Telephone Encounter (Signed)
Has been taking his Allopurinol but woke up with a gout flare up. He can barely get his boot on. Has a mandatory meeting at work this evening in [place]. Requesting an increas in his dosing and also a prescription for Prednisone.

## 2019-01-24 NOTE — Telephone Encounter (Signed)
Left detailed message on voicemail.  

## 2019-02-16 NOTE — Progress Notes (Signed)
Subjective:    Patient ID: Alexander Logan, male    DOB: 11-25-1965, 53 y.o.   MRN: [id number]  HPI 53 y.o. WM with history of gout presents with pain.  Alexander Logan states Alexander Logan has been having more flares this year per patient. Went to urgent care and had indomethacin 50 mg tablets. 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   States the joint in his can be bilateral big MTP pain. Worse with work and working on his truck at home and bending his foot. Will become swollen, red, warm.  No other joint pain anywhere else, no rashes.  Alexander Logan drinks once a week, rarely eats shellfish.  Had diarrhea with colchicine in the past.  Patient is on allopurinol for gout, Alexander Logan is on 300mg .  Lab Results  Component Value Date   LABURIC 6.4 04/05/2017     Blood pressure 128/90, pulse 87, temperature (!) 97.5 F (36.4 C), weight 194 lb 3.2 oz (88.1 kg), SpO2 98 %.  Medications Current Outpatient Medications on File Prior to Visit  Medication Sig  . allopurinol (ZYLOPRIM) 300 MG tablet Take 1 tablet (300 mg total) by mouth daily.  . colchicine 0.6 MG tablet TAKE 1 TABLET BY MOUTH TWICE A DAY  . indomethacin (INDOCIN) 50 MG capsule 50 mg.   Current Facility-Administered Medications on File Prior to Visit  Medication  . dexamethasone (DECADRON) injection 10 mg    Problem list Alexander Logan has Gout and Seasonal allergies on their problem list.  Review of Systems  Constitutional: Negative for chills and fever.  Respiratory: Negative.   Cardiovascular: Negative.  Negative for leg swelling.  Musculoskeletal: Positive for arthralgias and joint swelling.  Skin: Negative.  Negative for rash and wound.       Objective:   Physical Exam Constitutional:      Appearance: Alexander Logan is well-developed.  HENT:     Head: Normocephalic and atraumatic.     Right Ear: External ear normal.     Left Ear: External ear normal.  Eyes:     Conjunctiva/sclera: Conjunctivae normal.     Pupils: Pupils are equal, round,  and reactive to light.  Neck:     Musculoskeletal: Normal range of motion and neck supple.     Thyroid: No thyromegaly.  Cardiovascular:     Rate and Rhythm: Normal rate and regular rhythm.     Heart sounds: No murmur.  Pulmonary:     Effort: Pulmonary effort is normal. No respiratory distress.     Breath sounds: Normal breath sounds. No wheezing.  Abdominal:     General: Bowel sounds are normal.     Palpations: Abdomen is soft.     Tenderness: There is no abdominal tenderness.  Musculoskeletal:     Comments: Left MTP with Tenderness to palpation, decrease extension and pain with flexion, no erythema, no warmth, no streaking, no edema, and normal distal neurovascular exam.   Lymphadenopathy:     Cervical: No cervical adenopathy.  Skin:    General: Skin is warm and dry.     Findings: No rash.  Neurological:     Mental Status: Alexander Logan is alert and oriented to person, place, and time.     Deep Tendon Reflexes: Reflexes are normal and symmetric.       Assessment & Plan:   Great toe pain, left -     CBC with Differential/Platelet -     COMPLETE METABOLIC PANEL WITH GFR -     Uric acid -  DG Toe Great Left; Future If negative for gout? OA versus pseuodogout ? Need autoimmune labs if negative- low risk though-no other redness, swelling, no rashes and not family history.

## 2019-02-20 ENCOUNTER — Other Ambulatory Visit: Payer: Self-pay

## 2019-02-20 ENCOUNTER — Encounter: Payer: Self-pay | Admitting: Physician Assistant

## 2019-02-20 ENCOUNTER — Ambulatory Visit (INDEPENDENT_AMBULATORY_CARE_PROVIDER_SITE_OTHER): Payer: Managed Care, Other (non HMO) | Admitting: Physician Assistant

## 2019-02-20 VITALS — BP 128/90 | HR 87 | Temp 97.5°F | Wt 194.2 lb

## 2019-02-20 DIAGNOSIS — M79675 Pain in left toe(s): Secondary | ICD-10-CM | POA: Diagnosis not present

## 2019-02-20 DIAGNOSIS — M1A0710 Idiopathic chronic gout, right ankle and foot, without tophus (tophi): Secondary | ICD-10-CM | POA: Diagnosis not present

## 2019-02-20 MED ORDER — INDOMETHACIN 50 MG PO CAPS: 50.0000 mg | ORAL_CAPSULE | Freq: Three times a day (TID) | ORAL | 0 refills | Status: DC

## 2019-02-20 NOTE — Patient Instructions (Signed)
INFORMATION ABOUT YOUR XRAY  Can walk into [address] building for an Personal assistant. They will have the order and take you back. You do not any paper work, I should get the result back today or tomorrow. This order is good for a year.  Can call [phone] to schedule an appointment if you wish.    Gout  Gout is a condition that causes painful swelling of the joints. Gout is a type of inflammation of the joints (arthritis). This condition is caused by having too much uric acid in the body. Uric acid is a chemical that forms when the body breaks down substances called purines. Purines are important for building body proteins. When the body has too much uric acid, sharp crystals can form and build up inside the joints. This causes pain and swelling. Gout attacks can happen quickly and may be very painful (acute gout). Over time, the attacks can affect more joints and become more frequent (chronic gout). Gout can also cause uric acid to build up under the skin and inside the kidneys. What are the causes? This condition is caused by too much uric acid in your blood. This can happen because:  Your kidneys do not remove enough uric acid from your blood. This is the most common cause.  Your body makes too much uric acid. This can happen with some cancers and cancer treatments. It can also occur if your body is breaking down too many red blood cells (hemolytic anemia).  You eat too many foods that are high in purines. These foods include organ meats and some seafood. Alcohol, especially beer, is also high in purines. A gout attack may be triggered by trauma or stress. What increases the risk? You are more likely to develop this condition if you:  Have a family history of gout.  Are male and middle-aged.  Are male and have gone through menopause.  Are obese.  Frequently drink alcohol, especially beer.  Are dehydrated.  Lose weight too quickly.  Have an organ transplant.  Have lead  poisoning.  Take certain medicines, including aspirin, cyclosporine, diuretics, levodopa, and niacin.  Have kidney disease.  Have a skin condition called psoriasis. What are the signs or symptoms? An attack of acute gout happens quickly. It usually occurs in just one joint. The most common place is the big toe. Attacks often start at night. Other joints that may be affected include joints of the feet, ankle, knee, fingers, wrist, or elbow. Symptoms of this condition may include:  Severe pain.  Warmth.  Swelling.  Stiffness.  Tenderness. The affected joint may be very painful to touch.  Shiny, red, or purple skin.  Chills and fever. Chronic gout may cause symptoms more frequently. More joints may be involved. You may also have white or yellow lumps (tophi) on your hands or feet or in other areas near your joints. How is this diagnosed? This condition is diagnosed based on your symptoms, medical history, and physical exam. You may have tests, such as:  Blood tests to measure uric acid levels.  Removal of joint fluid with a thin needle (aspiration) to look for uric acid crystals.  X-rays to look for joint damage. How is this treated? Treatment for this condition has two phases: treating an acute attack and preventing future attacks. Acute gout treatment may include medicines to reduce pain and swelling, including:  NSAIDs.  Steroids. These are strong anti-inflammatory medicines that can be taken by mouth (orally) or injected into a joint.  Colchicine. This medicine relieves pain and swelling when it is taken soon after an attack. It can be given by mouth or through an IV. Preventive treatment may include:  Daily use of smaller doses of NSAIDs or colchicine.  Use of a medicine that reduces uric acid levels in your blood.  Changes to your diet. You may need to see a dietitian about what to eat and drink to prevent gout. Follow these instructions at home: During a gout  attack   If directed, put ice on the affected area: ? Put ice in a plastic bag. ? Place a towel between your skin and the bag. ? Leave the ice on for 20 minutes, 2-3 times a day.  Raise (elevate) the affected joint above the level of your heart as often as possible.  Rest the joint as much as possible. If the affected joint is in your leg, you may be given crutches to use.  Follow instructions from your health care provider about eating or drinking restrictions. Avoiding future gout attacks  Follow a low-purine diet as told by your dietitian or health care provider. Avoid foods and drinks that are high in purines, including liver, kidney, anchovies, asparagus, herring, mushrooms, mussels, and beer.  Maintain a healthy weight or lose weight if you are overweight. If you want to lose weight, talk with your health care provider. It is important that you do not lose weight too quickly.  Start or maintain an exercise program as told by your health care provider. Eating and drinking  Drink enough fluids to keep your urine pale yellow.  If you drink alcohol: ? Limit how much you use to:  0-1 drink a day for women.  0-2 drinks a day for men. ? Be aware of how much alcohol is in your drink. In the [place] one drink equals one 12 oz bottle of beer (355 mL) one 5 oz glass of wine (148 mL), or one 1 oz glass of hard liquor (44 mL). General instructions  Take over-the-counter and prescription medicines only as told by your health care provider.  Do not drive or use heavy machinery while taking prescription pain medicine.  Return to your normal activities as told by your health care provider. Ask your health care provider what activities are safe for you.  Keep all follow-up visits as told by your health care provider. This is important. Contact a health care provider if you have:  Another gout attack.  Continuing symptoms of a gout attack after 10 days of treatment.  Side effects from  your medicines.  Chills or a fever.  Burning pain when you urinate.  Pain in your lower back or belly. Get help right away if you:  Have severe or uncontrolled pain.  Cannot urinate. Summary  Gout is painful swelling of the joints caused by inflammation.  The most common site of pain is the big toe, but it can affect other joints in the body.  Medicines and dietary changes can help to prevent and treat gout attacks. This information is not intended to replace advice given to you by your health care provider. Make sure you discuss any questions you have with your health care provider. Document Released: 05/08/2000 Document Revised: 12/01/2017 Document Reviewed: 12/01/2017 Elsevier Patient Education  2020 [employer].

## 2019-02-21 LAB — CBC WITH DIFFERENTIAL/PLATELET
Absolute Monocytes: 520 cells/uL (ref 200–950)
Basophils Absolute: 30 cells/uL (ref 0–200)
Basophils Relative: 0.6 %
Eosinophils Absolute: 100 cells/uL (ref 15–500)
Eosinophils Relative: 2 %
HCT: 46.9 % (ref 38.5–50.0)
Hemoglobin: 16.1 g/dL (ref 13.2–17.1)
Lymphs Abs: 1795 cells/uL (ref 850–3900)
MCH: 31.2 pg (ref 27.0–33.0)
MCHC: 34.3 g/dL (ref 32.0–36.0)
MCV: 90.9 fL (ref 80.0–100.0)
MPV: 9.7 fL (ref 7.5–12.5)
Monocytes Relative: 10.4 %
Neutro Abs: 2555 cells/uL (ref 1500–7800)
Neutrophils Relative %: 51.1 %
Platelets: 240 10*3/uL (ref 140–400)
RBC: 5.16 10*6/uL (ref 4.20–5.80)
RDW: 12.4 % (ref 11.0–15.0)
Total Lymphocyte: 35.9 %
WBC: 5 10*3/uL (ref 3.8–10.8)

## 2019-02-21 LAB — COMPLETE METABOLIC PANEL WITH GFR
AG Ratio: 1.9 (calc) (ref 1.0–2.5)
ALT: 32 U/L (ref 9–46)
AST: 22 U/L (ref 10–35)
Albumin: 4.8 g/dL (ref 3.6–5.1)
Alkaline phosphatase (APISO): 64 U/L (ref 35–144)
BUN: 13 mg/dL (ref 7–25)
CO2: 26 mmol/L (ref 20–32)
Calcium: 10 mg/dL (ref 8.6–10.3)
Chloride: 105 mmol/L (ref 98–110)
Creat: 1.18 mg/dL (ref 0.70–1.33)
GFR, Est African American: 82 mL/min/{1.73_m2} (ref >=60)
GFR, Est Non African American: 71 mL/min/{1.73_m2} (ref >=60)
Globulin: 2.5 g/dL (calc) (ref 1.9–3.7)
Glucose, Bld: 82 mg/dL (ref 65–99)
Potassium: 5 mmol/L (ref 3.5–5.3)
Sodium: 140 mmol/L (ref 135–146)
Total Bilirubin: 0.6 mg/dL (ref 0.2–1.2)
Total Protein: 7.3 g/dL (ref 6.1–8.1)

## 2019-02-21 LAB — URIC ACID: Uric Acid, Serum: 5.6 mg/dL (ref 4.0–8.0)

## 2019-03-01 NOTE — Addendum Note (Signed)
Addended by: Vicie Mutters R on: 03/01/2019 12:34 PM   Modules accepted: Orders

## 2019-03-09 ENCOUNTER — Encounter: Payer: Managed Care, Other (non HMO) | Admitting: Adult Health

## 2019-03-16 ENCOUNTER — Ambulatory Visit (INDEPENDENT_AMBULATORY_CARE_PROVIDER_SITE_OTHER): Payer: Managed Care, Other (non HMO)

## 2019-03-16 ENCOUNTER — Ambulatory Visit: Payer: Managed Care, Other (non HMO) | Admitting: Podiatry

## 2019-03-16 ENCOUNTER — Encounter: Payer: Self-pay | Admitting: Podiatry

## 2019-03-16 ENCOUNTER — Other Ambulatory Visit: Payer: Self-pay

## 2019-03-16 VITALS — BP 133/85 | HR 74 | Resp 16

## 2019-03-16 DIAGNOSIS — M1000 Idiopathic gout, unspecified site: Secondary | ICD-10-CM

## 2019-03-16 DIAGNOSIS — M205X2 Other deformities of toe(s) (acquired), left foot: Secondary | ICD-10-CM

## 2019-03-16 DIAGNOSIS — M778 Other enthesopathies, not elsewhere classified: Secondary | ICD-10-CM

## 2019-03-16 NOTE — Patient Instructions (Signed)

## 2019-03-19 NOTE — Progress Notes (Signed)
Subjective:   Patient ID: Alexander Logan, male   DOB: 53 y.o.   MRN: [id number]   HPI Patient presents stating he has had achiness and problems with the big toe joint left for several years and is worsened recently and he has been given a tentative diagnosis of gout and takes allopurinol indomethacin daily.  States that recently has become quite more inflamed and it is consistent versus sporadic.  Patient does not smoke likes to be active   Review of Systems  All other systems reviewed and are negative.       Objective:  Physical Exam Vitals signs and nursing note reviewed.  Constitutional:      Appearance: He is well-developed.  Pulmonary:     Effort: Pulmonary effort is normal.  Musculoskeletal: Normal range of motion.  Skin:    General: Skin is warm.  Neurological:     Mental Status: He is alert.     Neurovascular status intact muscle strength found to be adequate range of motion within normal limits.  Patient is found to have acute discomfort around the first MPJ left with inflammation fluid around the joint surface and pain with palpation.  Patient is noted to have good digital perfusion well oriented x3 and no other indications of pathology.     Assessment:  Probability was some form of low-grade hallux limitus condition right which is probably functional with inflammatory capsulitis with possibility of mild joint damage and also gout is secondary possibility     Plan:  H&P all conditions reviewed and today I did sterile prep and carefully injected around the joint 3 mg Kenalog 5 mg Xylocaine to reduce inflammation and advised on rigid bottom shoes and discussed long-term orthotics.  Reappoint 3 weeks to reevaluate  X-rays indicate he does have a small dorsal spur of the first metatarsal and possible low-grade damage to the first MPJ left

## 2019-03-21 ENCOUNTER — Other Ambulatory Visit: Payer: Self-pay | Admitting: Physician Assistant

## 2019-04-06 ENCOUNTER — Other Ambulatory Visit: Payer: Self-pay

## 2019-04-06 ENCOUNTER — Encounter: Payer: Self-pay | Admitting: Podiatry

## 2019-04-06 ENCOUNTER — Ambulatory Visit: Payer: Managed Care, Other (non HMO) | Admitting: Podiatry

## 2019-04-06 DIAGNOSIS — M205X2 Other deformities of toe(s) (acquired), left foot: Secondary | ICD-10-CM | POA: Diagnosis not present

## 2019-04-06 DIAGNOSIS — M1000 Idiopathic gout, unspecified site: Secondary | ICD-10-CM

## 2019-04-06 NOTE — Patient Instructions (Signed)
Pre-Operative Instructions  Congratulations, you have decided to take an important step towards improving your quality of life.  You can be assured that the doctors and staff at [facility] will be with you every step of the way.  Here are some important things you should know:  1. Plan to be at the surgery center/hospital at least 1 (one) hour prior to your scheduled time, unless otherwise directed by the surgical center/hospital staff.  You must have a responsible adult accompany you, remain during the surgery and drive you home.  Make sure you have directions to the surgical center/hospital to ensure you arrive on time. 2. If you are having surgery at [facility] or [facility], you will need a copy of your medical history and physical form from your family physician within one month prior to the date of surgery. We will give you a form for your primary physician to complete.  3. We make every effort to accommodate the date you request for surgery.  However, there are times where surgery dates or times have to be moved.  We will contact you as soon as possible if a change in schedule is required.   4. No aspirin/ibuprofen for one week before surgery.  If you are on aspirin, any non-steroidal anti-inflammatory medications (Mobic, Aleve, Ibuprofen) should not be taken seven (7) days prior to your surgery.  You make take Tylenol for pain prior to surgery.  5. Medications - If you are taking daily heart and blood pressure medications, seizure, reflux, allergy, asthma, anxiety, pain or diabetes medications, make sure you notify the surgery center/hospital before the day of surgery so they can tell you which medications you should take or avoid the day of surgery. 6. No food or drink after midnight the night before surgery unless directed otherwise by surgical center/hospital staff. 7. No alcoholic beverages 24-hours prior to surgery.  No smoking 24-hours prior or 24-hours after  surgery. 8. Wear loose pants or shorts. They should be loose enough to fit over bandages, boots, and casts. 9. Don't wear slip-on shoes. Sneakers are preferred. 10. Bring your boot with you to the surgery center/hospital.  Also bring crutches or a walker if your physician has prescribed it for you.  If you do not have this equipment, it will be provided for you after surgery. 11. If you have not been contacted by the surgery center/hospital by the day before your surgery, call to confirm the date and time of your surgery. 12. Leave-time from work may vary depending on the type of surgery you have.  Appropriate arrangements should be made prior to surgery with your employer. 13. Prescriptions will be provided immediately following surgery by your doctor.  Fill these as soon as possible after surgery and take the medication as directed. Pain medications will not be refilled on weekends and must be approved by the doctor. 14. Remove nail polish on the operative foot and avoid getting pedicures prior to surgery. 15. Wash the night before surgery.  The night before surgery wash the foot and leg well with water and the antibacterial soap provided. Be sure to pay special attention to beneath the toenails and in between the toes.  Wash for at least three (3) minutes. Rinse thoroughly with water and dry well with a towel.  Perform this wash unless told not to do so by your physician.  Enclosed: 1 Ice pack (please put in freezer the night before surgery)   1 Hibiclens skin cleaner     Pre-op instructions  If you have any questions regarding the instructions, please do not hesitate to call our office.  [place]: [address], [place], [place] [address] -- [phone]  [place]: [address] [place], [place] [address] -- [phone]  [place]: [address]  [place],  [address] -- [phone]   Website: [email or web address] 

## 2019-04-10 ENCOUNTER — Telehealth: Payer: Self-pay | Admitting: *Deleted

## 2019-04-10 NOTE — Telephone Encounter (Signed)
That's fine. Can have a 6 day dospak

## 2019-04-10 NOTE — Telephone Encounter (Signed)
Pt states he was seen by Dr. Paulla Dolly 04/06/2019 and his foot is bothering him, and would like a steroid pack to get him to his surgery 05/02/2019.

## 2019-04-11 MED ORDER — METHYLPREDNISOLONE 4 MG PO TBPK: ORAL_TABLET | ORAL | 0 refills | Status: DC

## 2019-04-11 NOTE — Telephone Encounter (Signed)
I informed pt the medrol dose pack had been sent to the [id number].

## 2019-04-11 NOTE — Progress Notes (Signed)
Subjective:   Patient ID: Alexander Logan, male   DOB: 53 y.o.   MRN: [id number]   HPI Patient presents stating has had a lot of problems around the big toe joint of his left foot and he feels like he is going to need to have it fixed stating that he had relief for about a week after up with the injection and and has reoccurred again and it is very tender directly on the bone   ROS      Objective:  Physical Exam  Neurovascular status intact with inflammation pain right on the first metatarsal head left medial dorsal eminence with redness and pain with palpation with no other indications of pathology associated with this     Assessment:  Appears to be some form of a condition that may be bone structure or possible impingement of nerve.     Plan:  Reviewed this at great length and despite the fact he does not have a severe deformity on x-ray clinically it is very tender right on the spot and I do recommend a removal of bone spur distal osteotomy explaining the great length this may not solve the problem.  Patient wants to have this fixed understanding this and at this point I allowed him to read consent form for correction going over all possible complications alternative treatments and the fact this may not cure his problem.  Patient is willing to accept risk signed consent form scheduled for outpatient surgery understanding can take 6 months to 1 year to heal

## 2019-04-13 ENCOUNTER — Telehealth: Payer: Self-pay | Admitting: Podiatry

## 2019-04-13 NOTE — Telephone Encounter (Signed)
DOS: 05/02/2019  SURGICAL PROCEDURE: Altamese [place] with Removal Spur [id number])  Cign[employer] Effective Date: 05/25/2017 -  Deductible is $500 with $98.51 met and $401.49 remaining. Out of Pocket is $2,500 with $311.12 met and $2,188.88 remaining. CoInsurance is 80% / 20%.  No Prior Authorization or Referral is required per [facility]. Call reference # [id number].

## 2019-04-17 ENCOUNTER — Telehealth: Payer: Self-pay | Admitting: Podiatry

## 2019-04-17 NOTE — Telephone Encounter (Signed)
Contacted pt to see if he had a new surgery date in mind. Pt stated he would like to do 12 January. I told the pt I would get his sx rescheduled and let the surgical center know as well. Also reminded him they would call him a day or two prior to let him know what time to arrive.  Contacted Caren Griffins to reschedule pt's sx date with her.

## 2019-04-17 NOTE — Telephone Encounter (Signed)
Please give me a call back to reschedule my surgery date. Thank you.

## 2019-04-27 ENCOUNTER — Telehealth: Payer: Self-pay

## 2019-04-27 ENCOUNTER — Other Ambulatory Visit: Payer: Self-pay | Admitting: Internal Medicine

## 2019-04-27 MED ORDER — BENZONATATE 200 MG PO CAPS: ORAL_CAPSULE | ORAL | 1 refills | Status: DC

## 2019-04-27 MED ORDER — DEXAMETHASONE 4 MG PO TABS: ORAL_TABLET | ORAL | 0 refills | Status: DC

## 2019-04-27 NOTE — Telephone Encounter (Signed)
Spoke with patient's wife today to inform her that the first message was sent back to the provider about sending in a cough med & again today the message was sent, but that the provider will address the concern as soon as possible. And as soon as it is sent, I will call the patient.

## 2019-04-28 ENCOUNTER — Other Ambulatory Visit: Payer: Self-pay | Admitting: Physician Assistant

## 2019-04-28 MED ORDER — PROMETHAZINE-CODEINE 6.25-10 MG/5ML PO SYRP: 5.0000 mL | ORAL_SOLUTION | Freq: Four times a day (QID) | ORAL | 0 refills | Status: DC | PRN

## 2019-05-04 ENCOUNTER — Other Ambulatory Visit: Payer: Self-pay | Admitting: Internal Medicine

## 2019-05-09 ENCOUNTER — Other Ambulatory Visit: Payer: Self-pay | Admitting: Internal Medicine

## 2019-05-11 ENCOUNTER — Encounter: Payer: Managed Care, Other (non HMO) | Admitting: Podiatry

## 2019-05-12 ENCOUNTER — Other Ambulatory Visit: Payer: Self-pay | Admitting: Internal Medicine

## 2019-05-12 MED ORDER — PROMETHAZINE-CODEINE 6.25-10 MG/5ML PO SYRP: 5.0000 mL | ORAL_SOLUTION | Freq: Four times a day (QID) | ORAL | 1 refills | Status: DC | PRN

## 2019-05-12 MED ORDER — AZITHROMYCIN 250 MG PO TABS: ORAL_TABLET | ORAL | 1 refills | Status: DC

## 2019-05-12 MED ORDER — DEXAMETHASONE 4 MG PO TABS: ORAL_TABLET | ORAL | 0 refills | Status: DC

## 2019-05-15 ENCOUNTER — Telehealth: Payer: Self-pay | Admitting: Podiatry

## 2019-05-15 NOTE — Telephone Encounter (Signed)
Pt called to reschedule his surgery currently scheduled for 06 June 2019. Pt requested to reschedule his surgery to 04 July 2019. I told him I would contact the surgical center with the date change for his surgery. Pt asked me to send him a message with his new surgery date. I told him I would send him a message through [place].  I contacted Caren Griffins at the surgical center and gave her the patients new surgery date.

## 2019-05-16 ENCOUNTER — Other Ambulatory Visit: Payer: Self-pay | Admitting: Adult Health

## 2019-05-16 DIAGNOSIS — M1A0710 Idiopathic chronic gout, right ankle and foot, without tophus (tophi): Secondary | ICD-10-CM

## 2019-05-25 ENCOUNTER — Encounter: Payer: Managed Care, Other (non HMO) | Admitting: Podiatry

## 2019-06-12 ENCOUNTER — Encounter: Payer: Managed Care, Other (non HMO) | Admitting: Podiatry

## 2019-06-16 ENCOUNTER — Telehealth: Payer: Self-pay | Admitting: Podiatry

## 2019-06-16 NOTE — Telephone Encounter (Signed)
DOS: 07/04/2019  SURGICAL PROCEDURE: Altamese [place] w/ Removal of Spur [phone]).  [employer] Effective 05/25/2017 -  Individual Deductible is $500 with $0 met and $500 remaining. Family Deductible is $1,000 with $25.98 met and $974.02 remaining. Individual Out of Pocket is $2,500 with $0 met and $2,500 remaining. Family Out of Pocket is $5,000 with$221.18 met and $4,778.82 remaining. CoInsurance is 80% / 20%.  Per [facility]ne Du no prior authorization is required. Call ref# [id number].

## 2019-06-19 NOTE — Telephone Encounter (Signed)
Called pt back regarding cancelling his sx. Pt stated he wants to cancel for now due to COVID. Told pt to call if he needs any conservative treatment and also gave him my direct number for when he would like to reschedule his sx.  I notified Aram Beecham at [facility] to cancel the sx, and I've also cancelled the sx on Dr. Beverlee Nims schedules in both the sx book and Epic. I have also cancelled pt's postop appointments.

## 2019-06-19 NOTE — Telephone Encounter (Signed)
I need to cancel my sx until further notice. My number is [phone]. Thank you.

## 2019-06-19 NOTE — Telephone Encounter (Signed)
I need to cancel my sx for February. If you would, give me a call. Thank you.

## 2019-06-26 ENCOUNTER — Encounter: Payer: Managed Care, Other (non HMO) | Admitting: Podiatry

## 2019-07-12 ENCOUNTER — Encounter: Payer: Managed Care, Other (non HMO) | Admitting: Podiatry

## 2019-07-26 ENCOUNTER — Encounter: Payer: Managed Care, Other (non HMO) | Admitting: Podiatry

## 2019-08-04 ENCOUNTER — Encounter: Payer: Managed Care, Other (non HMO) | Admitting: Podiatry

## 2019-08-09 ENCOUNTER — Encounter: Payer: Managed Care, Other (non HMO) | Admitting: Podiatry

## 2019-08-14 ENCOUNTER — Encounter: Payer: Managed Care, Other (non HMO) | Admitting: Podiatry

## 2019-10-05 ENCOUNTER — Ambulatory Visit: Payer: Managed Care, Other (non HMO) | Admitting: Physician Assistant

## 2019-10-05 ENCOUNTER — Encounter: Payer: Self-pay | Admitting: Physician Assistant

## 2019-10-05 ENCOUNTER — Other Ambulatory Visit: Payer: Self-pay

## 2019-10-05 VITALS — BP 132/78 | HR 96 | Temp 97.7°F | Wt 192.4 lb

## 2019-10-05 DIAGNOSIS — M5441 Lumbago with sciatica, right side: Secondary | ICD-10-CM | POA: Diagnosis not present

## 2019-10-05 MED ORDER — PREDNISONE 20 MG PO TABS: ORAL_TABLET | ORAL | 0 refills | Status: AC

## 2019-10-05 MED ORDER — BACLOFEN 10 MG PO TABS: ORAL_TABLET | ORAL | 1 refills | Status: DC

## 2019-10-05 NOTE — Patient Instructions (Signed)
INFORMATION ABOUT YOUR XRAY  Can walk into [address] for an Insurance account manager. They will have the order and take you back. You do not any paper work, I should get the result back today or tomorrow. This order is good for a year.  Can call [phone] to schedule an appointment if you wish.   BACK PAIN  Try the exercises and other information in the back care manual.   You can take baclofen if needed at bedtime for muscle spasm. This can be taken up to every 8 hours, but causes sedation, so should not drive or operate heavy machinery while taking this medicine.   Go to the ER if you have any new weakness in your legs, have trouble controlling your urine or bowels, or have worsening pain.   Back pain Rehab Ask your health care provider which exercises are safe for you. Do exercises exactly as told by your health care provider and adjust them as directed. It is normal to feel mild stretching, pulling, tightness, or discomfort as you do these exercises, but you should stop right away if you feel sudden pain or your pain gets worse. Do not begin these exercises until told by your health care provider. Stretching and range of motion exercises These exercises warm up your muscles and joints and improve the movement and flexibility of your hips and your back. These exercises also help to relieve pain, numbness, and tingling. Exercise A: Sciatic nerve glide 1. Sit in a chair with your head facing down toward your chest. Place your hands behind your back. Let your shoulders slump forward. 2. Slowly straighten one of your knees while you tilt your head back as if you are looking toward the ceiling. Only straighten your leg as far as you can without making your symptoms worse. 3. Hold for __________ seconds. 4. Slowly return to the starting position. 5. Repeat with your other leg. Repeat __________ times. Complete this exercise __________ times a day. Exercise B: Knee to chest with hip adduction and  internal rotation  1. Lie on your back on a firm surface with both legs straight. 2. Bend one of your knees and move it up toward your chest until you feel a gentle stretch in your lower back and buttock. Then, move your knee toward the shoulder that is on the opposite side from your leg. ? Hold your leg in this position by holding onto the front of your knee. 3. Hold for __________ seconds. 4. Slowly return to the starting position. 5. Repeat with your other leg. Repeat __________ times. Complete this exercise __________ times a day. Exercise C: Prone extension on elbows  1. Lie on your abdomen on a firm surface. A bed may be too soft for this exercise. 2. Prop yourself up on your elbows. 3. Use your arms to help lift your chest up until you feel a gentle stretch in your abdomen and your lower back. ? This will place some of your body weight on your elbows. If this is uncomfortable, try stacking pillows under your chest. ? Your hips should stay down, against the surface that you are lying on. Keep your hip and back muscles relaxed. 4. Hold for __________ seconds. 5. Slowly relax your upper body and return to the starting position. Repeat __________ times. Complete this exercise __________ times a day. Strengthening exercises These exercises build strength and endurance in your back. Endurance is the ability to use your muscles for a long time, even after they  get tired. Exercise D: Pelvic tilt 1. Lie on your back on a firm surface. Bend your knees and keep your feet flat. 2. Tense your abdominal muscles. Tip your pelvis up toward the ceiling and flatten your lower back into the floor. ? To help with this exercise, you may place a small towel under your lower back and try to push your back into the towel. 3. Hold for __________ seconds. 4. Let your muscles relax completely before you repeat this exercise. Repeat __________ times. Complete this exercise __________ times a day. Exercise E:  Alternating arm and leg raises  1. Get on your hands and knees on a firm surface. If you are on a hard floor, you may want to use padding to cushion your knees, such as an exercise mat. 2. Line up your arms and legs. Your hands should be below your shoulders, and your knees should be below your hips. 3. Lift your left leg behind you. At the same time, raise your right arm and straighten it in front of you. ? Do not lift your leg higher than your hip. ? Do not lift your arm higher than your shoulder. ? Keep your abdominal and back muscles tight. ? Keep your hips facing the ground. ? Do not arch your back. ? Keep your balance carefully, and do not hold your breath. 4. Hold for __________ seconds. 5. Slowly return to the starting position and repeat with your right leg and your left arm. Repeat __________ times. Complete this exercise __________ times a day. Posture and body mechanics  Body mechanics refers to the movements and positions of your body while you do your daily activities. Posture is part of body mechanics. Good posture and healthy body mechanics can help to relieve stress in your body's tissues and joints. Good posture means that your spine is in its natural S-curve position (your spine is neutral), your shoulders are pulled back slightly, and your head is not tipped forward. The following are general guidelines for applying improved posture and body mechanics to your everyday activities. Standing   When standing, keep your spine neutral and your feet about hip-width apart. Keep a slight bend in your knees. Your ears, shoulders, and hips should line up.  When you do a task in which you stand in one place for a long time, place one foot up on a stable object that is 2-4 inches (5-10 cm) high, such as a footstool. This helps keep your spine neutral. Sitting   When sitting, keep your spine neutral and keep your feet flat on the floor. Use a footrest, if necessary, and keep your  thighs parallel to the floor. Avoid rounding your shoulders, and avoid tilting your head forward.  When working at a desk or a computer, keep your desk at a height where your hands are slightly lower than your elbows. Slide your chair under your desk so you are close enough to maintain good posture.  When working at a computer, place your monitor at a height where you are looking straight ahead and you do not have to tilt your head forward or downward to look at the screen. Resting   When lying down and resting, avoid positions that are most painful for you.  If you have pain with activities such as sitting, bending, stooping, or squatting (flexion-based activities), lie in a position in which your body does not bend very much. For example, avoid curling up on your side with your arms and knees near  your chest (fetal position).  If you have pain with activities such as standing for a long time or reaching with your arms (extension-based activities), lie with your spine in a neutral position and bend your knees slightly. Try the following positions: ? Lying on your side with a pillow between your knees. ? Lying on your back with a pillow under your knees. Lifting   When lifting objects, keep your feet at least shoulder-width apart and tighten your abdominal muscles.  Bend your knees and hips and keep your spine neutral. It is important to lift using the strength of your legs, not your back. Do not lock your knees straight out.  Always ask for help to lift heavy or awkward objects. This information is not intended to replace advice given to you by your health care provider. Make sure you discuss any questions you have with your health care provider. Document Released: 05/11/2005 Document Revised: 01/16/2016 Document Reviewed: 01/25/2015 Elsevier Interactive Patient Education  [employer].

## 2019-10-05 NOTE — Progress Notes (Signed)
SUBJECTIVE:  Alexander Logan is a 54 y.o. male who complains of a low back pain 1-2 month(s) ago.  The pain is positional with bending or lifting or standing up, with radiation down the the posterior right leg to his knee, does not go to his foot. Some tingling in his right leg at times.  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  Will take 4 pills of motrin once in the AM. Pain is 7/10.  Symptoms have been worsening since that time.  Prior history of back problems: no prior back problems and recurrent self limited episodes of low back pain in the past.  Patient denies fever, hematuria, incontinence, numbness, weakness and saddle anesthesia  Allergies No Known Allergies  SURGICAL HISTORY He  has a past surgical history that includes Knee arthroscopy w/ meniscectomy (Left, 2014). FAMILY HISTORY His family history includes Diabetes in his father. SOCIAL HISTORY He  reports that he has never smoked. He has never used smokeless tobacco. He reports that he does not drink alcohol or use drugs.   OBJECTIVE: Blood pressure 132/78, pulse 96, temperature 97.7 F (36.5 C), weight 192 lb 6.4 oz (87.3 kg), SpO2 97 %. Patient is able to ambulate well. Gait is  Antalgic. Straight leg raising with dorsiflexion positive weakly positive radicular symptom on the right but can be from pain. Sensory exam in the legs are abnormal  on right lateral leg. Knee reflexes are normal Ankle reflexes are normal Strength is normal and symmetric in arms and legs. There is not SI tenderness to palpation.  There is paraspinal muscle spasm.  There is not midline tenderness.  ROM of spine with  limited in all spheres due to pain. Able to go on his toes.   ASSESSMENT AND PLAN::  Lower back pain- questionable straight leg and decreased sensation right lateral leg Prednisone was prescribed,NSAIDs, RICE, and exercise given Will get Xray and refer to ortho due to pain and some possible positive neuro  exam.   Call or return to clinic prn if these symptoms worsen or fail to improve as anticipated.

## 2019-10-10 ENCOUNTER — Ambulatory Visit
Admission: RE | Admit: 2019-10-10 | Discharge: 2019-10-10 | Disposition: A | Discharge status: Home / Self Care | Payer: Managed Care, Other (non HMO) | Source: Ambulatory Visit | Attending: Physician Assistant | Admitting: Physician Assistant

## 2019-10-10 DIAGNOSIS — M5441 Lumbago with sciatica, right side: Secondary | ICD-10-CM

## 2019-10-18 ENCOUNTER — Ambulatory Visit: Payer: Managed Care, Other (non HMO) | Admitting: Orthopaedic Surgery

## 2019-10-18 ENCOUNTER — Other Ambulatory Visit: Payer: Self-pay

## 2019-10-18 ENCOUNTER — Encounter: Payer: Self-pay | Admitting: Orthopaedic Surgery

## 2019-10-18 VITALS — Ht 68.0 in | Wt 190.0 lb

## 2019-10-18 DIAGNOSIS — M545 Low back pain, unspecified: Secondary | ICD-10-CM

## 2019-10-18 DIAGNOSIS — G8929 Other chronic pain: Secondary | ICD-10-CM | POA: Diagnosis not present

## 2019-10-18 MED ORDER — TRAMADOL HCL 50 MG PO TABS: 50.0000 mg | ORAL_TABLET | Freq: Four times a day (QID) | ORAL | 0 refills | Status: DC | PRN

## 2019-10-18 NOTE — Progress Notes (Signed)
Office Visit Note   Patient: Alexander Logan           Date of Birth: 09/03/1965           MRN: [id number] Visit Date: 10/18/2019              Requested by: Quentin Mulling, PA-C [address] [place],  [place] [address] PCP: Lucky Cowboy, MD   Assessment & Plan: Visit Diagnoses: No diagnosis found.  Plan: Patient's lumbar x-rays show most significant degenerative changes at L1-2 and L2-3.  Levels below that appear normal on plain radiographs.  He got some relief with the prednisone taper.  We will set him up for some physical therapy at [facility] which is close to where he lives and is convenient.  I will check him back again in 3 weeks if he is having persistent problems we can consider diagnostic imaging.  Follow-Up Instructions: No follow-ups on file.   Orders:  No orders of the defined types were placed in this encounter.  No orders of the defined types were placed in this encounter.     Procedures: No procedures performed   Clinical Data: No additional findings.   Subjective: Chief Complaint  Patient presents with  . Lower Back - Pain    HPI 54 year old male new patient visit with back pain that radiates from his back down his left leg.  Patient had pain for a month and a half he states that it radiates to the lateral leg down to the knee and occasionally has some numbness.  He has problems with both sitting standing as well as changing positions.  He took a prednisone taper pack which helped to some degree.  Patient does have history of gout and has been on allopurinol 300 mg daily. Patient has seen Dr. Lynford Humphrey and was referred here for further treatment.  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  He has been taking ibuprofen sometimes more frequently than he should.  No bowel or bladder symptoms.  No symptoms of neurogenic claudication or myelopathy. Review of Systems all systems are negative other  than as listed above in HPI.   Objective: Vital Signs: Ht 5\' 8"  (1.727 m)   Wt 190 lb (86.2 kg)   BMI 28.89 kg/m   Physical Exam Constitutional:      Appearance: He is well-developed.  HENT:     Head: Normocephalic and atraumatic.  Eyes:     Pupils: Pupils are equal, round, and reactive to light.  Neck:     Thyroid: No thyromegaly.     Trachea: No tracheal deviation.  Cardiovascular:     Rate and Rhythm: Normal rate.  Pulmonary:     Effort: Pulmonary effort is normal.     Breath sounds: No wheezing.  Abdominal:     General: Bowel sounds are normal.     Palpations: Abdomen is soft.  Skin:    General: Skin is warm and dry.     Capillary Refill: Capillary refill takes less than 2 seconds.  Neurological:     Mental Status: He is alert and oriented to person, place, and time.  Psychiatric:        Behavior: Behavior normal.        Thought Content: Thought content normal.        Judgment: Judgment normal.     Ortho Exam patient is able to heel and toe walk.  He has  some discomfort with straight leg raising on the right negative on the left knee and ankle jerk are intact anterior tib gastrocsoleus EHL is active, strong and symmetrical.  Specialty Comments:  No specialty comments available.  Imaging: CLINICAL DATA:  Low back and right leg pain for 1 month without known injury.  EXAM: LUMBAR SPINE - COMPLETE 4+ VIEW  COMPARISON:  None.  FINDINGS: No fracture or spondylolisthesis is noted. Mild degenerative disc disease is noted at L1-2 with anterior osteophyte formation. Anterior osteophyte formation is also noted at L2-3. Remaining disc spaces are unremarkable.  IMPRESSION: Mild degenerative disc disease is noted at L1-2 and L2-3. No acute abnormality seen in the lumbar spine.   Electronically Signed   By: Marijo Conception M.D.   PMFS History: Patient Active Problem List   Diagnosis Date Noted  . Seasonal allergies 12/05/2018  . Gout 09/28/2016    Past Medical History:  Diagnosis Date  . Gout     Family History  Problem Relation Age of Onset  . Diabetes Father     Past Surgical History:  Procedure Laterality Date  . KNEE ARTHROSCOPY W/ MENISCECTOMY Left 2014   Social History   Occupational History  . Not on file  Tobacco Use  . Smoking status: Never Smoker  . Smokeless tobacco: Never Used  Substance and Sexual Activity  . Alcohol use: No  . Drug use: No  . Sexual activity: Yes

## 2019-10-30 ENCOUNTER — Other Ambulatory Visit: Payer: Self-pay | Admitting: Physician Assistant

## 2019-10-30 DIAGNOSIS — M5441 Lumbago with sciatica, right side: Secondary | ICD-10-CM

## 2019-10-31 ENCOUNTER — Other Ambulatory Visit: Payer: Self-pay

## 2019-10-31 ENCOUNTER — Ambulatory Visit: Payer: Managed Care, Other (non HMO) | Attending: Orthopaedic Surgery

## 2019-10-31 DIAGNOSIS — G8929 Other chronic pain: Secondary | ICD-10-CM | POA: Insufficient documentation

## 2019-10-31 DIAGNOSIS — M5441 Lumbago with sciatica, right side: Secondary | ICD-10-CM | POA: Insufficient documentation

## 2019-10-31 DIAGNOSIS — R262 Difficulty in walking, not elsewhere classified: Secondary | ICD-10-CM | POA: Insufficient documentation

## 2019-10-31 DIAGNOSIS — M5136 Other intervertebral disc degeneration, lumbar region: Secondary | ICD-10-CM

## 2019-10-31 DIAGNOSIS — R293 Abnormal posture: Secondary | ICD-10-CM | POA: Diagnosis present
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
## 2019-11-15 ENCOUNTER — Ambulatory Visit: Payer: Managed Care, Other (non HMO) | Admitting: Physical Therapy

## 2019-11-22 ENCOUNTER — Ambulatory Visit: Payer: Managed Care, Other (non HMO) | Admitting: Physical Therapy

## 2019-11-26 ENCOUNTER — Other Ambulatory Visit: Payer: Self-pay | Admitting: Internal Medicine

## 2019-11-26 DIAGNOSIS — M5441 Lumbago with sciatica, right side: Secondary | ICD-10-CM

## 2019-11-29 ENCOUNTER — Ambulatory Visit: Payer: Managed Care, Other (non HMO) | Admitting: Physical Therapy

## 2019-12-06 ENCOUNTER — Ambulatory Visit: Payer: Managed Care, Other (non HMO) | Admitting: Physical Therapy

## 2019-12-13 ENCOUNTER — Ambulatory Visit: Payer: Managed Care, Other (non HMO) | Admitting: Physical Therapy

## 2019-12-20 ENCOUNTER — Ambulatory Visit: Payer: Managed Care, Other (non HMO) | Admitting: Physical Therapy

## 2019-12-20 ENCOUNTER — Other Ambulatory Visit: Payer: Self-pay | Admitting: Internal Medicine

## 2019-12-20 DIAGNOSIS — M5441 Lumbago with sciatica, right side: Secondary | ICD-10-CM

## 2020-01-11 ENCOUNTER — Other Ambulatory Visit: Payer: Self-pay | Admitting: Internal Medicine

## 2020-01-11 DIAGNOSIS — M5441 Lumbago with sciatica, right side: Secondary | ICD-10-CM

## 2020-02-12 ENCOUNTER — Other Ambulatory Visit: Payer: Self-pay | Admitting: Internal Medicine

## 2020-02-12 DIAGNOSIS — M5441 Lumbago with sciatica, right side: Secondary | ICD-10-CM

## 2020-03-01 ENCOUNTER — Other Ambulatory Visit: Payer: Self-pay | Admitting: Adult Health

## 2020-03-01 DIAGNOSIS — M5441 Lumbago with sciatica, right side: Secondary | ICD-10-CM

## 2020-03-11 ENCOUNTER — Encounter: Payer: Managed Care, Other (non HMO) | Admitting: Adult Health

## 2020-04-09 ENCOUNTER — Encounter: Payer: Self-pay | Admitting: Internal Medicine

## 2020-04-09 ENCOUNTER — Ambulatory Visit: Payer: Managed Care, Other (non HMO) | Admitting: Internal Medicine

## 2020-04-09 ENCOUNTER — Other Ambulatory Visit: Payer: Self-pay

## 2020-04-09 VITALS — BP 124/80 | HR 98 | Temp 97.4°F | Resp 16 | Ht 68.0 in | Wt 194.6 lb

## 2020-04-09 DIAGNOSIS — J0410 Acute tracheitis without obstruction: Secondary | ICD-10-CM

## 2020-04-09 DIAGNOSIS — J0100 Acute maxillary sinusitis, unspecified: Secondary | ICD-10-CM

## 2020-04-09 MED ORDER — PROMETHAZINE-CODEINE 6.25-10 MG/5ML PO SYRP: ORAL_SOLUTION | ORAL | 1 refills | Status: DC

## 2020-04-09 MED ORDER — AZITHROMYCIN 250 MG PO TABS: ORAL_TABLET | ORAL | 1 refills | Status: DC

## 2020-04-09 MED ORDER — DEXAMETHASONE 4 MG PO TABS: ORAL_TABLET | ORAL | 0 refills | Status: DC

## 2020-04-09 NOTE — Progress Notes (Signed)
   History of Present Illness:     This very nice 54 yo MWM  Presents with HA, Head & Chest congestion, productive cough. Denies fever, chills, dyspnea, sweats, rash.  Medications  .  Allopurinol 300 MG tablet, Take 1 tablet Daily to Prevent Gout .  indomethacin 50 MG capsule, TAKE 1 CAPSULE 3 X /DAY WITH MEALS IF NEEDED FOR GOUT PAIN .  traMADol (ULTRAM) 50 MG tablet, Take 1 tablet (50 mg total) by mouth every 6 (six) hours as needed. .  baclofen (LIORESAL) 10 MG tablet, TAKE 1/2 TO 1 TABLET AT BEDTIME IF NEEDED FOR MUSCLESPASM  Problem list He has Gout and Seasonal allergies on their problem list.   Observations/Objective:   BP 124/80   Pulse 98   Temp (!) 97.4 F (36.3 C)   Resp 16   Ht 5\' 8"  (1.727 m)   Wt 194 lb 9.6 oz (88.3 kg)   SpO2 98%   BMI 29.59 kg/m   Brassy cough.   HEENT - EAC' & TMs - Nl. - (+) maxillary tenderness. PERRLA. N/O/P - Clear.  Neck - supple.  Chest - Equal BS. Cor - Nl HS. RRR w/o sig MGR. PP 1(+).  MS- FROM w/o deformities.  Gait Nl. Neuro -  Nl w/o focal abnormalities.  Assessment and Plan:   1. Tracheitis  - dexamethasone  4 MG tablet; Take 1 tab 3 x day - 3 days, then 2 x day - 3 days, then 1 tab daily  Dispense: 20 tablet  - azithromycin  250 MG tablet; Take 2 tablets with Food on  Day 1, then 1 tablet Daily with Food for Infection  Dispense: 6 each; Refill: 1  - promethazine-codeine  6.25-10 MG/5ML syrup; Take 1 or 2 teaspoonful every 4 hours as needed for Cough / Congestion  Dispense: 360 mL; Refill: 1  2. Acute non-recurrent maxillary sinusitis  - dexamethasone  4 MG tablet; Take 1 tab 3 x day - 3 days, then 2 x day - 3 days, then 1 tab daily  Dispense: 20 tablet  - azithromycin  250 MG tablet; Take 2 tablets with Food on  Day 1, then 1 tablet Daily with Food for Infection  Dispense: 6 each; Refill: 1  Follow Up Instructions:        I discussed the assessment and treatment plan with the patient. The patient was provided an  opportunity to ask questions and all were answered. The patient agreed with the plan and demonstrated an understanding of the instructions.       Patient was advised & given OOW note for 11/16-18. The patient was advised to call back or seek an in-person evaluation if the symptoms worsen or if the condition fails to improve as anticipated.   03-13-1979, MD

## 2020-04-16 ENCOUNTER — Ambulatory Visit: Payer: Managed Care, Other (non HMO) | Admitting: Internal Medicine

## 2020-06-06 ENCOUNTER — Other Ambulatory Visit: Payer: Self-pay | Admitting: Internal Medicine

## 2020-06-06 DIAGNOSIS — J0410 Acute tracheitis without obstruction: Secondary | ICD-10-CM

## 2020-06-06 DIAGNOSIS — J0100 Acute maxillary sinusitis, unspecified: Secondary | ICD-10-CM

## 2020-06-06 MED ORDER — PROMETHAZINE-CODEINE 6.25-10 MG/5ML PO SYRP: ORAL_SOLUTION | ORAL | 1 refills | Status: DC

## 2020-06-06 MED ORDER — DEXAMETHASONE 4 MG PO TABS: ORAL_TABLET | ORAL | 0 refills | Status: DC

## 2020-06-06 MED ORDER — AZITHROMYCIN 250 MG PO TABS: ORAL_TABLET | ORAL | 0 refills | Status: DC

## 2020-08-30 ENCOUNTER — Other Ambulatory Visit: Payer: Self-pay | Admitting: Adult Health

## 2020-08-30 DIAGNOSIS — M5441 Lumbago with sciatica, right side: Secondary | ICD-10-CM

## 2020-09-09 ENCOUNTER — Encounter: Payer: Self-pay | Admitting: Internal Medicine

## 2020-09-09 NOTE — Patient Instructions (Signed)

## 2020-09-09 NOTE — Progress Notes (Signed)
Annual  Screening/Preventative Visit  & Comprehensive Evaluation & Examination   Future Appointments  Date Time Provider Department Center  09/10/2020 10:00 AM Lucky Cowboy, MD [facility] None  09/11/2021  3:00 PM Lucky Cowboy, MD [facility] None                                               This very nice 55 y.o. MWM  With hx/o Gout (Rt podagra)  presents for a Screening /Preventative Visit & comprehensive evaluation.       Patient has been monitored for labile elevated BP's for 4 years with none reported. Patient's BP has also  been controlled at home.  Today's BP is at goal - 124/74. Patient denies any cardiac symptoms as chest pain, palpitations, shortness of breath, dizziness or ankle swelling.      Patient's lipids have not been monitored for over 4 years.      Patient has no k/o elevated glucoses.  Patient denies reactive hypoglycemic symptoms, visual blurring, diabetic polys or paresthesias.       Finally, patient has no knowledge of Vitamin D ever monitored & is on no Vit D supplements.  Current Outpatient Medications on File Prior to Visit  Medication Sig  . allopurinol 300 MG t Take 1 tablet Daily to Prevent Gout  . baclofen  10 MG  TAKE 1/2 TO 1 TABLET AT BEDTIME IF NEEDED   . indomethacin  50 MG c TAKE 1 CAPSULE 3 X /DAY WITH MEALS IF NEEDED     No Known Allergies  Past Medical History:  Diagnosis Date  . Gout     Health Maintenance  Topic Date Due  . Hepatitis C Screening  Never done  . HIV Screening  Never done  . TETANUS/TDAP  Never done  . COLONOSCOPY  Never done  . COVID-19 Vaccine (2 - Pfizer 3-dose series) 07/20/2020  . INFLUENZA VACCINE  12/23/2020  . HPV VACCINES  Aged Out    Immunization History  Administered Date(s) Administered  . Influenza Inj Mdck Quad  04/05/2017  . Influenza,inj,quad 03/20/2017  . Influenza 03/20/2017  . PFIZER Covid-19 Vaccine 06/29/2020    Last Colon - 2018 Colonoscopy   Past Surgical History:   Procedure Laterality Date  . KNEE ARTHROSCOPY W/ MENISCECTOMY Left 2014    Family History  Problem Relation Age of Onset  . Diabetes Father     Social History   Socioeconomic History  . Marital status: Married    Spouse name: Darl Pikes  . Number of children: Twin sons 63 yo & 2 GC  Occupational History  Tobacco Use  . Smoking status: Never Smoker  . Smokeless tobacco: Never Used  Substance and Sexual Activity  . Alcohol use: No  . Drug use: No  . Sexual activity: Yes   ROS Constitutional: Denies fever, chills, weight loss/gain, headaches, insomnia,  night sweats or change in appetite. Does c/o fatigue. Eyes: Denies redness, blurred vision, diplopia, discharge, itchy or watery eyes.  ENT: Denies discharge, congestion, post nasal drip, epistaxis, sore throat, earache, hearing loss, dental pain, Tinnitus, Vertigo, Sinus pain or snoring.  Cardio: Denies chest pain, palpitations, irregular heartbeat, syncope, dyspnea, diaphoresis, orthopnea, PND, claudication or edema Respiratory: denies cough, dyspnea, DOE, pleurisy, hoarseness, laryngitis or wheezing.  Gastrointestinal: Denies dysphagia, heartburn, reflux, water brash, pain, cramps, nausea, vomiting, bloating, diarrhea, constipation, hematemesis, melena, hematochezia, jaundice  or hemorrhoids Genitourinary: Denies dysuria, frequency, urgency, nocturia, hesitancy, discharge, hematuria or flank pain Musculoskeletal: Denies arthralgia, myalgia, stiffness, Jt. Swelling, pain, limp or strain/sprain. Denies Falls. Skin: Denies puritis, rash, hives, warts, acne, eczema or change in skin lesion Neuro: No weakness, tremor, incoordination, spasms, paresthesia or pain Psychiatric: Denies confusion, memory loss or sensory loss. Denies Depression. Endocrine: Denies change in weight, skin, hair change, nocturia, and paresthesia, diabetic polys, visual blurring or hyper / hypo glycemic episodes.  Heme/Lymph: No excessive bleeding, bruising or enlarged  lymph nodes.  Physical Exam  BP 124/74   Pulse 86   Temp 97.7 F (36.5 C) (Temporal)   Resp 16   Ht 5' 7.5" (1.715 m)   Wt 195 lb (88.5 kg)   SpO2 98%   BMI 30.09 kg/m   General Appearance: Well nourished and well groomed and in no apparent distress.  Eyes: PERRLA, EOMs, conjunctiva no swelling or erythema, normal fundi and vessels. Sinuses: No frontal/maxillary tenderness ENT/Mouth: EACs patent / TMs  nl. Nares clear without erythema, swelling, mucoid exudates. Oral hygiene is good. No erythema, swelling, or exudate. Tongue normal, non-obstructing. Tonsils not swollen or erythematous. Hearing normal.  Neck: Supple, thyroid not palpable. No bruits, nodes or JVD. Respiratory: Respiratory effort normal.  BS equal and clear bilateral without rales, rhonci, wheezing or stridor. Cardio: Heart sounds are normal with regular rate and rhythm and no murmurs, rubs or gallops. Peripheral pulses are normal and equal bilaterally without edema. No aortic or femoral bruits. Chest: symmetric with normal excursions and percussion.  Abdomen: Soft, with Nl bowel sounds. Nontender, no guarding, rebound, hernias, masses, or organomegaly.  Lymphatics: Non tender without lymphadenopathy.  Musculoskeletal: Full ROM all peripheral extremities, joint stability, 5/5 strength, and normal gait. Skin: Warm and dry without rashes, lesions, cyanosis, clubbing or  ecchymosis.  Neuro: Cranial nerves intact, reflexes equal bilaterally. Normal muscle tone, no cerebellar symptoms. Sensation intact.  Pysch: Alert and oriented X 3 with normal affect, insight and judgment appropriate.   Assessment and Plan  1. Annual Preventative/Screening Exam    2. Hypertension screen  - EKG 12-Lead - [place], RETROPERITNL ABD,  LTD - COMPLETE METABOLIC PANEL WITH GFR - Magnesium  3. Lipid screening  - EKG 12-Lead - [place], RETROPERITNL ABD,  LTD - Lipid panel  4. Glucose intolerance  - EKG 12-Lead - [place], RETROPERITNL ABD,   LTD - Hemoglobin A1c - Insulin, random  5. Vitamin D deficiency  - VITAMIN D 25 Hydroxy  6. Chronic idiopathic gout involving toe of right foot without tophus  - Uric acid  7. Prostate cancer screening  - PSA  8. Screening for colorectal cancer  - Cologuard  9. Screening-pulmonary TB  - TB Skin Test  10. Screening for ischemic heart disease  - EKG 12-Lead  11. FHx: heart disease  - EKG 12-Lead  12. Screening for AAA (aortic abdominal aneurysm)  - [place], RETROPERITNL ABD,  LTD  13. Fatigue, unspecified type  - Vitamin B12 - Testosterone - TB Skin Test - CBC with Differential/Platelet - TSH  14. Medication management  - Urinalysis, Routine w reflex microscopic - Microalbumin / creatinine urine ratio - Uric acid - CBC with Differential/Platelet - COMPLETE METABOLIC PANEL WITH GFR - Magnesium - Lipid panel - TSH - Hemoglobin A1c - Insulin, random - VITAMIN D 25 Hydroxy          control to achieve/maintain BMI less than 25, BP monitoring, regular exercise and medications as discussed.  Discussed med effects and SE's.  Routine screening labs and tests as requested with regular follow-up as recommended. Over 40 minutes of exam, counseling, chart review and high complex critical decision making was performed   Marinus Maw, MD

## 2020-09-10 ENCOUNTER — Ambulatory Visit: Payer: Managed Care, Other (non HMO) | Admitting: Internal Medicine

## 2020-09-10 ENCOUNTER — Other Ambulatory Visit: Payer: Self-pay

## 2020-09-10 VITALS — BP 124/74 | HR 86 | Temp 97.7°F | Resp 16 | Ht 67.5 in | Wt 195.0 lb

## 2020-09-10 DIAGNOSIS — Z131 Encounter for screening for diabetes mellitus: Secondary | ICD-10-CM

## 2020-09-10 DIAGNOSIS — Z136 Encounter for screening for cardiovascular disorders: Secondary | ICD-10-CM

## 2020-09-10 DIAGNOSIS — R350 Frequency of micturition: Secondary | ICD-10-CM

## 2020-09-10 DIAGNOSIS — E7439 Other disorders of intestinal carbohydrate absorption: Secondary | ICD-10-CM

## 2020-09-10 DIAGNOSIS — N401 Enlarged prostate with lower urinary tract symptoms: Secondary | ICD-10-CM

## 2020-09-10 DIAGNOSIS — I10 Essential (primary) hypertension: Secondary | ICD-10-CM

## 2020-09-10 DIAGNOSIS — Z1211 Encounter for screening for malignant neoplasm of colon: Secondary | ICD-10-CM

## 2020-09-10 DIAGNOSIS — Z1329 Encounter for screening for other suspected endocrine disorder: Secondary | ICD-10-CM | POA: Diagnosis not present

## 2020-09-10 DIAGNOSIS — Z8249 Family history of ischemic heart disease and other diseases of the circulatory system: Secondary | ICD-10-CM

## 2020-09-10 DIAGNOSIS — M1A0710 Idiopathic chronic gout, right ankle and foot, without tophus (tophi): Secondary | ICD-10-CM | POA: Diagnosis not present

## 2020-09-10 DIAGNOSIS — Z0000 Encounter for general adult medical examination without abnormal findings: Secondary | ICD-10-CM

## 2020-09-10 DIAGNOSIS — Z1389 Encounter for screening for other disorder: Secondary | ICD-10-CM

## 2020-09-10 DIAGNOSIS — Z125 Encounter for screening for malignant neoplasm of prostate: Secondary | ICD-10-CM

## 2020-09-10 DIAGNOSIS — Z130 Encounter for screening for diseases of the blood and blood-forming organs and certain disorders involving the immune mechanism: Secondary | ICD-10-CM | POA: Diagnosis not present

## 2020-09-10 DIAGNOSIS — R5383 Other fatigue: Secondary | ICD-10-CM

## 2020-09-10 DIAGNOSIS — Z13220 Encounter for screening for lipoid disorders: Secondary | ICD-10-CM

## 2020-09-10 DIAGNOSIS — Z79899 Other long term (current) drug therapy: Secondary | ICD-10-CM | POA: Diagnosis not present

## 2020-09-10 DIAGNOSIS — E559 Vitamin D deficiency, unspecified: Secondary | ICD-10-CM

## 2020-09-10 DIAGNOSIS — Z0001 Encounter for general adult medical examination with abnormal findings: Secondary | ICD-10-CM

## 2020-09-10 DIAGNOSIS — Z111 Encounter for screening for respiratory tuberculosis: Secondary | ICD-10-CM

## 2020-09-10 NOTE — Progress Notes (Signed)
AortaScan < 3 cm. Normal per Dr McKeown. 

## 2020-09-11 ENCOUNTER — Other Ambulatory Visit: Payer: Self-pay | Admitting: Internal Medicine

## 2020-09-11 DIAGNOSIS — E782 Mixed hyperlipidemia: Secondary | ICD-10-CM

## 2020-09-11 LAB — URINALYSIS, ROUTINE W REFLEX MICROSCOPIC
Bilirubin Urine: NEGATIVE
Glucose, UA: NEGATIVE
Hgb urine dipstick: NEGATIVE
Ketones, ur: NEGATIVE
Leukocytes,Ua: NEGATIVE
Nitrite: NEGATIVE
Protein, ur: NEGATIVE
Specific Gravity, Urine: 1.018 (ref 1.001–1.03)
pH: 5.5 (ref 5.0–8.0)

## 2020-09-11 LAB — COMPLETE METABOLIC PANEL WITH GFR
AG Ratio: 1.8 (calc) (ref 1.0–2.5)
ALT: 49 U/L — ABNORMAL HIGH (ref 9–46)
AST: 28 U/L (ref 10–35)
Albumin: 4.6 g/dL (ref 3.6–5.1)
Alkaline phosphatase (APISO): 69 U/L (ref 35–144)
BUN: 15 mg/dL (ref 7–25)
CO2: 27 mmol/L (ref 20–32)
Calcium: 10.2 mg/dL (ref 8.6–10.3)
Chloride: 103 mmol/L (ref 98–110)
Creat: 1.14 mg/dL (ref 0.70–1.33)
GFR, Est African American: 85 mL/min/{1.73_m2} (ref >=60)
GFR, Est Non African American: 73 mL/min/{1.73_m2} (ref >=60)
Globulin: 2.6 g/dL (calc) (ref 1.9–3.7)
Glucose, Bld: 116 mg/dL — ABNORMAL HIGH (ref 65–99)
Potassium: 4.3 mmol/L (ref 3.5–5.3)
Sodium: 141 mmol/L (ref 135–146)
Total Bilirubin: 0.6 mg/dL (ref 0.2–1.2)
Total Protein: 7.2 g/dL (ref 6.1–8.1)

## 2020-09-11 LAB — CBC WITH DIFFERENTIAL/PLATELET
Absolute Monocytes: 340 cells/uL (ref 200–950)
Basophils Absolute: 30 cells/uL (ref 0–200)
Basophils Relative: 0.7 %
Eosinophils Absolute: 77 cells/uL (ref 15–500)
Eosinophils Relative: 1.8 %
HCT: 54.1 % — ABNORMAL HIGH (ref 38.5–50.0)
Hemoglobin: 18.1 g/dL — ABNORMAL HIGH (ref 13.2–17.1)
Lymphs Abs: 1122 cells/uL (ref 850–3900)
MCH: 31 pg (ref 27.0–33.0)
MCHC: 33.5 g/dL (ref 32.0–36.0)
MCV: 92.8 fL (ref 80.0–100.0)
MPV: 10.5 fL (ref 7.5–12.5)
Monocytes Relative: 7.9 %
Neutro Abs: 2731 cells/uL (ref 1500–7800)
Neutrophils Relative %: 63.5 %
Platelets: 218 10*3/uL (ref 140–400)
RBC: 5.83 10*6/uL — ABNORMAL HIGH (ref 4.20–5.80)
RDW: 12.7 % (ref 11.0–15.0)
Total Lymphocyte: 26.1 %
WBC: 4.3 10*3/uL (ref 3.8–10.8)

## 2020-09-11 LAB — TESTOSTERONE: Testosterone: 752 ng/dL (ref 250–827)

## 2020-09-11 LAB — HEMOGLOBIN A1C
Hgb A1c MFr Bld: 5.2 % of total Hgb (ref <5.7)
Mean Plasma Glucose: 103 mg/dL
eAG (mmol/L): 5.7 mmol/L

## 2020-09-11 LAB — INSULIN, RANDOM: Insulin: 111.4 u[IU]/mL — ABNORMAL HIGH

## 2020-09-11 LAB — LIPID PANEL
Cholesterol: 269 mg/dL — ABNORMAL HIGH (ref <200)
HDL: 45 mg/dL (ref >=40)
LDL Cholesterol (Calc): 158 mg/dL (calc) — ABNORMAL HIGH
Non-HDL Cholesterol (Calc): 224 mg/dL (calc) — ABNORMAL HIGH (ref <130)
Total CHOL/HDL Ratio: 6 (calc) — ABNORMAL HIGH (ref <5.0)
Triglycerides: 390 mg/dL — ABNORMAL HIGH (ref <150)

## 2020-09-11 LAB — MICROALBUMIN / CREATININE URINE RATIO
Creatinine, Urine: 143 mg/dL (ref 20–320)
Microalb Creat Ratio: 3 mcg/mg creat (ref <30)
Microalb, Ur: 0.5 mg/dL

## 2020-09-11 LAB — PSA: PSA: 1.8 ng/mL (ref <=4.0)

## 2020-09-11 LAB — VITAMIN D 25 HYDROXY (VIT D DEFICIENCY, FRACTURES): Vit D, 25-Hydroxy: 23 ng/mL — ABNORMAL LOW (ref 30–100)

## 2020-09-11 LAB — TSH: TSH: 0.51 mIU/L (ref 0.40–4.50)

## 2020-09-11 LAB — URIC ACID: Uric Acid, Serum: 8.5 mg/dL — ABNORMAL HIGH (ref 4.0–8.0)

## 2020-09-11 LAB — VITAMIN B12: Vitamin B-12: 277 pg/mL (ref 200–1100)

## 2020-09-11 LAB — MAGNESIUM: Magnesium: 2.2 mg/dL (ref 1.5–2.5)

## 2020-09-11 MED ORDER — ROSUVASTATIN CALCIUM 20 MG PO TABS: ORAL_TABLET | ORAL | 3 refills | Status: DC

## 2020-09-11 NOTE — Progress Notes (Signed)
============================================================ - Test results slightly outside the reference range are not unusual. If there is anything important, I will review this with you,  otherwise it is considered normal test values.  If you have further questions,  please do not hesitate to contact me at the office or via My Chart.  ============================================================ ============================================================  -  Vitamin B12 = 277       Very Low  (Ideal or Goal Vit B12 is between 450 - 1,100)   Low Vit B12 may be associated with Anemia , Fatigue,   Peripheral Neuropathy, Dementia, "Brain Fog", & Depression  - Recommend take a sub-lingual form of Vitamin B12 tablet   1,000 to 5,000 mcg tab that you dissolve under your tongue /Daily   - Can get Lavonia Dana - best price at [employer] or on [employer] ============================================================ ============================================================  -  PSA - low - Great  ============================================================ ============================================================  - Testosterone = 752 - Very good level   ============================================================ ============================================================  -  Uric Acid / Gout test = 8.5 is elevated        (Ideal or Goal in a Gout patient is less than 6.0   !   )   - So don't miss doses of meds, Avoid Alcohol  &  - Avoid Meat - Red meat  (Beef & Pork) & [place]  ============================================================ ============================================================  -  Total Chol = 269 - very high risk       (Ideal or Goal is less than 180)   - and   - Bad or Dangerous LDL Chol = 158 - Also very High         (Ideal or Goal is less than 70  !  )   - Recommend low cholesterol diet   - Cholesterol only comes from animal sources  - ie. meat,  dairy, egg yolks  - Eat all the vegetables you want.  - Avoid meat, especially red meat - Beef AND Pork .  - Avoid cheese & dairy - milk & ice cream.     - Cheese is the most concentrated form of trans-fats which  is the worst thing to clog up our arteries.   - Veggie cheese is OK which can be found in the fresh  produce section at [employer] or [employer] or [employer] ============================================================ ============================================================  -  Will send in a Rx for Rosuvastatin (Crestor)  to your [facility] on [address] ============================================================ ============================================================  -  Also Triglycerides (  390  ) or fats in blood are too high                 (goal is less than 150)    - Recommend avoid fried & greasy foods,  sweets / candy,   - Avoid white rice  (brown or wild rice or Quinoa is OK),   - Avoid white potatoes  (sweet potatoes are OK)   - Avoid anything made from white flour  - bagels, doughnuts, rolls, buns, biscuits, white and   wheat breads, pizza crust and traditional  pasta made of white flour & egg white  - (vegetarian pasta or spinach or wheat pasta is OK).    - Multi-grain bread is OK - like multi-grain flat bread or  sandwich thins.   - Avoid alcohol in excess.   - Exercise is also important. ============================================================ ============================================================  -  Please call office to schedule an office visit in  about 3 months to recheck cholesterol ============================================================ ============================================================  -  A1c Normal - Great - No Diabetes   ! ============================================================ ============================================================  -  But Insulin level = 111.4 very elevated  (normal is less than 20 )   -  And an elevated  Insulin Level shows insulin resistance   - a sign of early diabetes and associated with a 300 % greater risk for                Heart Attacks, Strokes,Cancer & Alzheimer type Vascular Dementia   - All this can be cured  and prevented with losing weight   - get Dr Francis Dowse Fuhrman's book 'the End of Diabetes" and "the End of Dieting"   - and add many years of good health to your life.  ============================================================ ============================================================  -  Vitamin D = 23 - is horribly Low   - Vitamin D goal is between 70-100.   ============================================================ [id number] ============================================================  - Please take  Vitamin D 5,000 unit capsule x 2 capsules = 10,000 units /day   ============================================================ [id number] ============================================================  - It is very important as a natural anti-inflammatory and helping the  immune system protect against viral infections, like the Covid-19   -  helping hair, skin, and nails, as well as reducing stroke and  heart attack risk.   - It helps your bones and helps with mood.  - It also decreases numerous cancer risks so please  take it as directed.   - Low Vit D is associated with a 200-300% higher risk for  CANCER   and 200-300% higher risk for HEART   ATTACK  &  STROKE.    - It is also associated with higher death rate at younger ages,   -  autoimmune diseases like Rheumatoid arthritis, Lupus,  Multiple Sclerosis.     - Also  many other serious conditions, like depression, Alzheimer's  Dementia, infertility, muscle aches, fatigue, fibromyalgia   - just to name a few. ============================================================ ============================================================  -  All Else - CBC - Kidneys - U/A - Electrolytes - Liver - Magnesium & Thyroid    - all  Normal / OK ============================================================ ============================================================  -  Please schedule an office Visit in about 3 months to recheck cholesterol & Vit D  ============================================================ ============================================================  -  bill mck

## 2020-10-09 ENCOUNTER — Encounter: Payer: Managed Care, Other (non HMO) | Admitting: Internal Medicine

## 2020-12-11 ENCOUNTER — Ambulatory Visit: Payer: Managed Care, Other (non HMO) | Admitting: Internal Medicine

## 2020-12-11 ENCOUNTER — Other Ambulatory Visit: Payer: Self-pay

## 2020-12-11 ENCOUNTER — Encounter: Payer: Self-pay | Admitting: Internal Medicine

## 2020-12-11 VITALS — BP 118/70 | HR 74 | Temp 97.8°F | Resp 16 | Ht 67.5 in | Wt 185.0 lb

## 2020-12-11 DIAGNOSIS — E7439 Other disorders of intestinal carbohydrate absorption: Secondary | ICD-10-CM | POA: Diagnosis not present

## 2020-12-11 DIAGNOSIS — R030 Elevated blood-pressure reading, without diagnosis of hypertension: Secondary | ICD-10-CM

## 2020-12-11 DIAGNOSIS — E559 Vitamin D deficiency, unspecified: Secondary | ICD-10-CM | POA: Diagnosis not present

## 2020-12-11 DIAGNOSIS — E782 Mixed hyperlipidemia: Secondary | ICD-10-CM

## 2020-12-11 DIAGNOSIS — Z79899 Other long term (current) drug therapy: Secondary | ICD-10-CM

## 2020-12-11 DIAGNOSIS — M1A0710 Idiopathic chronic gout, right ankle and foot, without tophus (tophi): Secondary | ICD-10-CM

## 2020-12-11 NOTE — Patient Instructions (Signed)

## 2020-12-11 NOTE — Progress Notes (Signed)
Future Appointments  Date Time Provider Department Center  12/11/2020  4:30 PM Lucky Cowboy, MD [facility] None  09/11/2021  3:00 PM Lucky Cowboy, MD [facility] None    History of Present Illness:       This very nice 55 y.o. MWM presents for 3 month follow up monitoring for elev BP, HLD, Pre-Diabetes and Vitamin D Deficiency.  Patient does have hx/o Gout (Rt podagra).         Patient is monitored expectantly for labile HTN & BP's  has been controlled at home. Today's BP is at goal - 118/70. Patient has had no complaints of any cardiac type chest pain, palpitations, dyspnea / orthopnea / PND, dizziness, claudication, or dependent edema.       Hyperlipidemia is controlled with diet & meds. Patient denies myalgias or other med SE's. 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  Last A1c was   Lab Results  Component Value Date   HGBA1C 5.1 12/11/2020        Further, the patient also has history of Vitamin D Deficiency and supplements vitamin D without any suspected side-effects. Last vitamin D was still very low (goal  70-100):   Lab Results  Component Value Date   VD25OH 67 12/11/2020     Current Outpatient Medications on File Prior to Visit  Medication Sig   allopurinol (ZYLOPRIM) 300 MG tablet Take 1 tablet Daily to Prevent Gout   baclofen (LIORESAL) 10 MG tablet TAKE 1/2 TO 1 TABLET AT BEDTIME IF NEEDED FOR MUSCLESPASM   indomethacin (INDOCIN) 50 MG capsule TAKE 1 CAPSULE 3 X /DAY WITH MEALS IF NEEDED FOR GOUT PAIN   rosuvastatin (CRESTOR) 20 MG tablet Take 1 tablet Daily for Cholesterol   No current facility-administered medications on file prior to visit.     No Known  Allergies   PMHx:   Past Medical History:  Diagnosis Date   Gout      Immunization History  Administered Date(s) Administered   Influenza Inj Mdck Quad  04/05/2017   Influenza,inj,quad 03/20/2017   Influenza 03/20/2017   PFIZER Comirnaty Covid-19 Tri-[place]ucrose Vaccine 06/29/2020   PPD Test 09/10/2020     Past Surgical History:  Procedure Laterality Date   KNEE ARTHROSCOPY W/ MENISCECTOMY Left 2014    FHx:    Reviewed / unchanged  SHx:    Reviewed / unchanged   Systems Review:  Constitutional: Denies fever, chills, wt changes, headaches, insomnia, fatigue, night sweats, change in appetite. Eyes: Denies redness, blurred vision, diplopia, discharge, itchy, watery eyes.  ENT: Denies discharge, congestion, post nasal drip, epistaxis, sore throat, earache, hearing loss, dental pain, tinnitus, vertigo, sinus pain, snoring.  CV: Denies chest pain, palpitations, irregular heartbeat, syncope, dyspnea, diaphoresis, orthopnea, PND, claudication or edema. Respiratory: denies cough, dyspnea, DOE, pleurisy, hoarseness, laryngitis, wheezing.  Gastrointestinal: Denies dysphagia, odynophagia, heartburn, reflux, water brash, abdominal pain or cramps, nausea, vomiting, bloating, diarrhea, constipation, hematemesis, melena, hematochezia  or hemorrhoids. Genitourinary: Denies dysuria, frequency, urgency, nocturia, hesitancy, discharge, hematuria or flank pain. Musculoskeletal: Denies arthralgias, myalgias, stiffness, jt. swelling, pain, limping or strain/sprain.  Skin: Denies pruritus, rash, hives, warts, acne, eczema or change in skin  lesion(s). Neuro: No weakness, tremor, incoordination, spasms, paresthesia or pain. Psychiatric: Denies confusion, memory loss or sensory loss. Endo: Denies change in weight, skin or hair change.  Heme/Lymph: No excessive bleeding, bruising or enlarged lymph nodes.  Physical Exam  BP 118/70   Pulse 74   Temp 97.8 F (36.6 C)   Resp 16   Ht 5' 7.5" (1.715 m)    Wt 185 lb (83.9 kg)   SpO2 96%   BMI 28.55 kg/m   Appears  well nourished, well groomed  and in no distress.  Eyes: PERRLA, EOMs, conjunctiva no swelling or erythema. Sinuses: No frontal/maxillary tenderness ENT/Mouth: EAC's clear, TM's nl w/o erythema, bulging. Nares clear w/o erythema, swelling, exudates. Oropharynx clear without erythema or exudates. Oral hygiene is good. Tongue normal, non obstructing. Hearing intact.  Neck: Supple. Thyroid not palpable. Car 2+/2+ without bruits, nodes or JVD. Chest: Respirations nl with BS clear & equal w/o rales, rhonchi, wheezing or stridor.  Cor: Heart sounds normal w/ regular rate and rhythm without sig. murmurs, gallops, clicks or rubs. Peripheral pulses normal and equal  without edema.  Abdomen: Soft & bowel sounds normal. Non-tender w/o guarding, rebound, hernias, masses or organomegaly.  Lymphatics: Unremarkable.  Musculoskeletal: Full ROM all peripheral extremities, joint stability, 5/5 strength and normal gait.  Skin: Warm, dry without exposed rashes, lesions or ecchymosis apparent.  Neuro: Cranial nerves intact, reflexes equal bilaterally. Sensory-motor testing grossly intact. Tendon reflexes grossly intact.  Pysch: Alert & oriented x 3.  Insight and judgement nl & appropriate. No ideations.  Assessment and Plan:  1. Elevated BP without diagnosis of hypertension  - Continue medication, monitor blood pressure at home.  - Continue DASH diet.  Reminder to go to the ER if any CP,  SOB, nausea, dizziness, severe HA, changes vision/speech.   - CBC with Differential/Platelet - COMPLETE METABOLIC PANEL WITH GFR - Magnesium - TSH  2. Hyperlipidemia, mixed  - Continue diet/meds, exercise,& lifestyle modifications.  - Continue monitor periodic cholesterol/liver & renal functions    - Lipid panel - TSH  3. Glucose intolerance  - Continue diet, exercise  - Lifestyle modifications.  - Monitor appropriate labs   - Hemoglobin A1c -  Insulin, random  4. Vitamin D deficiency  - Continue supplementation   - VITAMIN D 25 Hydroxy  5. Chronic idiopathic gout   - Uric acid  6. Medication management  - CBC with Differential/Platelet - COMPLETE METABOLIC PANEL WITH GFR - Magnesium - Lipid panel - TSH - Hemoglobin A1c - Insulin, random - VITAMIN D 25 Hydroxy - Uric acid      Discussed  regular exercise, BP monitoring, weight control to achieve/maintain BMI less than 25 and discussed med and SE's. Recommended labs to assess and monitor clinical status with further disposition pending results of labs.  I discussed the assessment and treatment plan with the patient. The patient was provided an opportunity to ask questions and all were answered. The patient agreed with the plan and demonstrated an understanding of the instructions.  I provided over 30 minutes of exam, counseling, chart review and  complex critical decision making.      The patient was advised to call back or seek an in-person evaluation if the symptoms worsen or if the condition fails to improve as anticipated.   Marinus Maw, MD

## 2020-12-12 LAB — HEMOGLOBIN A1C
Hgb A1c MFr Bld: 5.1 % of total Hgb (ref <5.7)
Mean Plasma Glucose: 100 mg/dL
eAG (mmol/L): 5.5 mmol/L

## 2020-12-12 LAB — CBC WITH DIFFERENTIAL/PLATELET
Absolute Monocytes: 454 cells/uL (ref 200–950)
Basophils Absolute: 32 cells/uL (ref 0–200)
Basophils Relative: 0.5 %
Eosinophils Absolute: 82 cells/uL (ref 15–500)
Eosinophils Relative: 1.3 %
HCT: 48.7 % (ref 38.5–50.0)
Hemoglobin: 16.6 g/dL (ref 13.2–17.1)
Lymphs Abs: 2054 cells/uL (ref 850–3900)
MCH: 31.6 pg (ref 27.0–33.0)
MCHC: 34.1 g/dL (ref 32.0–36.0)
MCV: 92.6 fL (ref 80.0–100.0)
MPV: 9.9 fL (ref 7.5–12.5)
Monocytes Relative: 7.2 %
Neutro Abs: 3679 cells/uL (ref 1500–7800)
Neutrophils Relative %: 58.4 %
Platelets: 192 10*3/uL (ref 140–400)
RBC: 5.26 10*6/uL (ref 4.20–5.80)
RDW: 12.2 % (ref 11.0–15.0)
Total Lymphocyte: 32.6 %
WBC: 6.3 10*3/uL (ref 3.8–10.8)

## 2020-12-12 LAB — COMPLETE METABOLIC PANEL WITH GFR
AG Ratio: 1.9 (calc) (ref 1.0–2.5)
ALT: 33 U/L (ref 9–46)
AST: 17 U/L (ref 10–35)
Albumin: 4.5 g/dL (ref 3.6–5.1)
Alkaline phosphatase (APISO): 70 U/L (ref 35–144)
BUN: 17 mg/dL (ref 7–25)
CO2: 27 mmol/L (ref 20–32)
Calcium: 9.7 mg/dL (ref 8.6–10.3)
Chloride: 104 mmol/L (ref 98–110)
Creat: 1.16 mg/dL (ref 0.70–1.30)
Globulin: 2.4 g/dL (calc) (ref 1.9–3.7)
Glucose, Bld: 85 mg/dL (ref 65–99)
Potassium: 4.4 mmol/L (ref 3.5–5.3)
Sodium: 139 mmol/L (ref 135–146)
Total Bilirubin: 0.6 mg/dL (ref 0.2–1.2)
Total Protein: 6.9 g/dL (ref 6.1–8.1)
eGFR: 75 mL/min/{1.73_m2} (ref >=60)

## 2020-12-12 LAB — INSULIN, RANDOM: Insulin: 8.9 u[IU]/mL

## 2020-12-12 LAB — LIPID PANEL
Cholesterol: 155 mg/dL (ref <200)
HDL: 50 mg/dL (ref >=40)
LDL Cholesterol (Calc): 85 mg/dL (calc)
Non-HDL Cholesterol (Calc): 105 mg/dL (calc) (ref <130)
Total CHOL/HDL Ratio: 3.1 (calc) (ref <5.0)
Triglycerides: 105 mg/dL (ref <150)

## 2020-12-12 LAB — TSH: TSH: 0.84 mIU/L (ref 0.40–4.50)

## 2020-12-12 LAB — MAGNESIUM: Magnesium: 2.1 mg/dL (ref 1.5–2.5)

## 2020-12-12 LAB — VITAMIN D 25 HYDROXY (VIT D DEFICIENCY, FRACTURES): Vit D, 25-Hydroxy: 67 ng/mL (ref 30–100)

## 2020-12-12 LAB — URIC ACID: Uric Acid, Serum: 5.3 mg/dL (ref 4.0–8.0)

## 2020-12-12 NOTE — Progress Notes (Signed)
============================================================ ============================================================  -   Lipids are Much , Much better  - Total Chol = 155 -  Excellent            (  Ideal or Goal is less than 150  !  )   - Very low risk for Heart Attack  / Stroke  - Bad LDL Chol = 85             (  Ideal or Goal is less than 70  !  )  ============================================================ ============================================================  - Vitamin D = 67 - Excellent  ! ============================================================ ============================================================  - All Else - CBC - Kidneys - Electrolytes - Liver - Magnesium & Thyroid    - all  Normal / OK ============================================================ ============================================================

## 2020-12-15 MED ORDER — PHENTERMINE HCL 37.5 MG PO TABS: ORAL_TABLET | ORAL | 1 refills | Status: DC

## 2020-12-15 MED ORDER — TOPIRAMATE 50 MG PO TABS: ORAL_TABLET | ORAL | 1 refills | Status: DC

## 2020-12-15 NOTE — Addendum Note (Signed)
Addended by: Lucky Cowboy on: 12/15/2020 10:11 PM   Modules accepted: Orders

## 2021-03-07 ENCOUNTER — Other Ambulatory Visit: Payer: Self-pay | Admitting: Adult Health Nurse Practitioner

## 2021-03-07 DIAGNOSIS — M5441 Lumbago with sciatica, right side: Secondary | ICD-10-CM

## 2021-03-11 ENCOUNTER — Encounter: Payer: Managed Care, Other (non HMO) | Admitting: Adult Health

## 2021-04-23 IMAGING — CR DG LUMBAR SPINE COMPLETE 4+V
5 series · 5 of 5 positions shown · non-contrast
Comparison: None.

CLINICAL DATA: Low back and right leg pain for 1 month without
known injury.

EXAM:
LUMBAR SPINE - COMPLETE 4+ VIEW

[t lumbar spine ap]
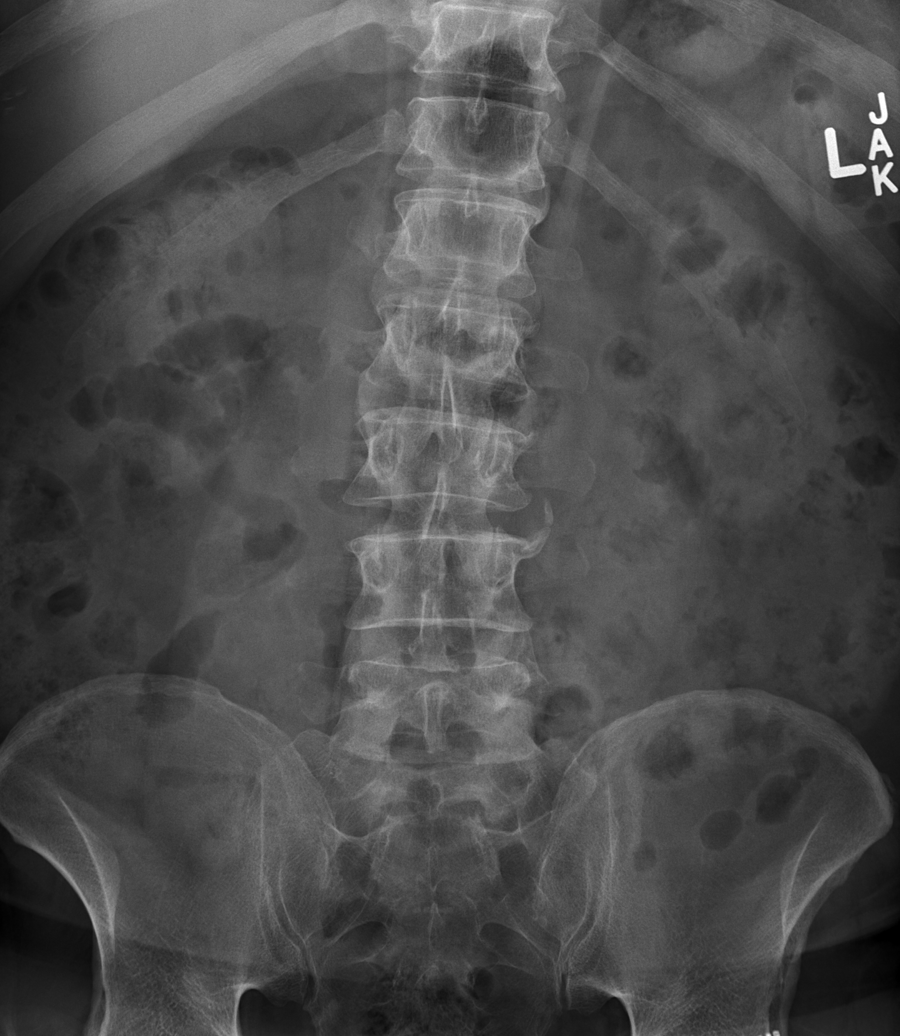

[t lumbar spine obl (1 of 2)]
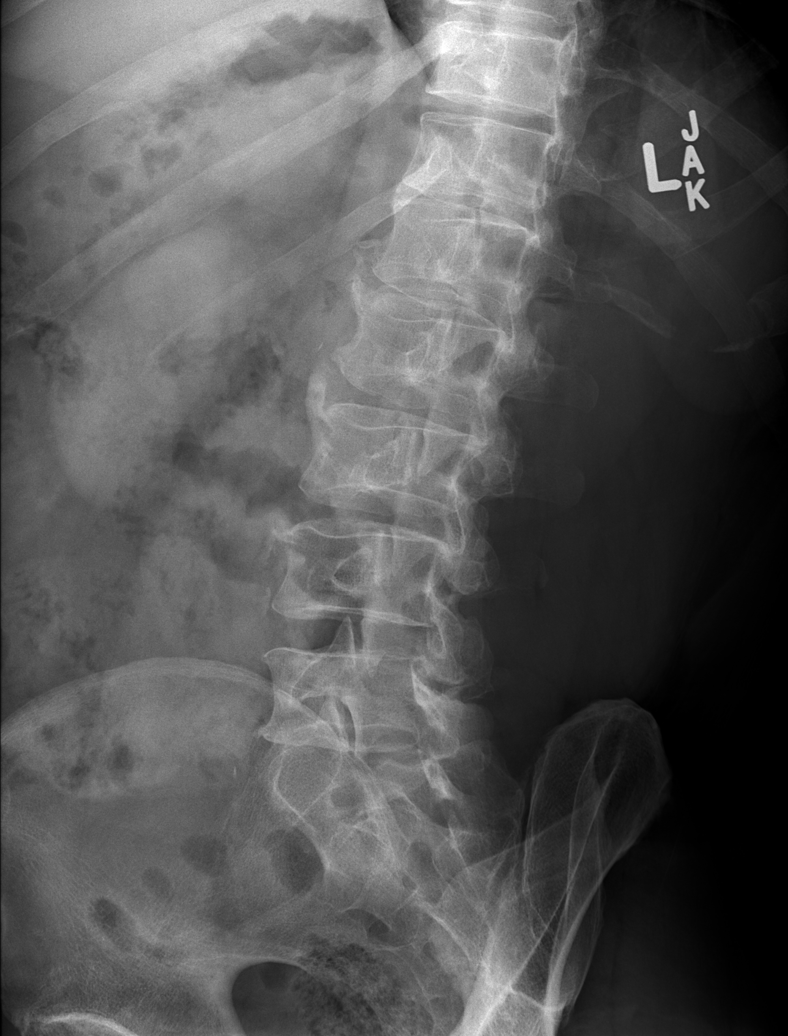

[t lumbar spine obl (2 of 2)]
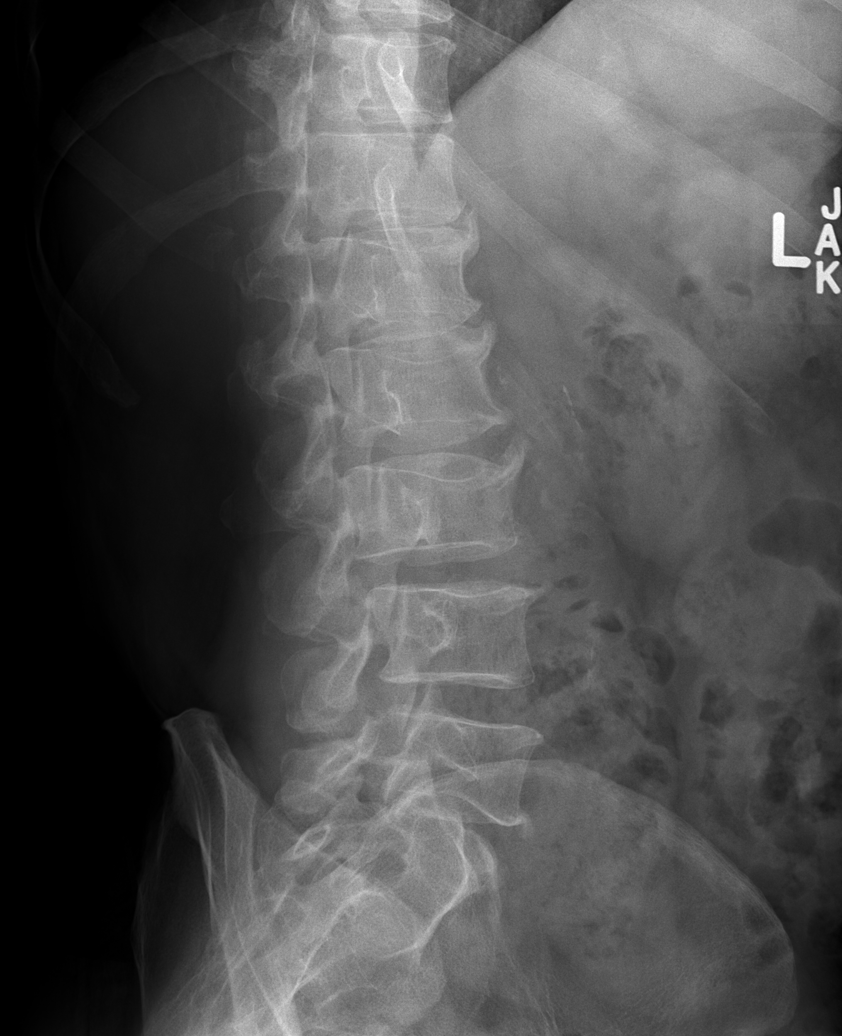

[t lumbar spine lat]
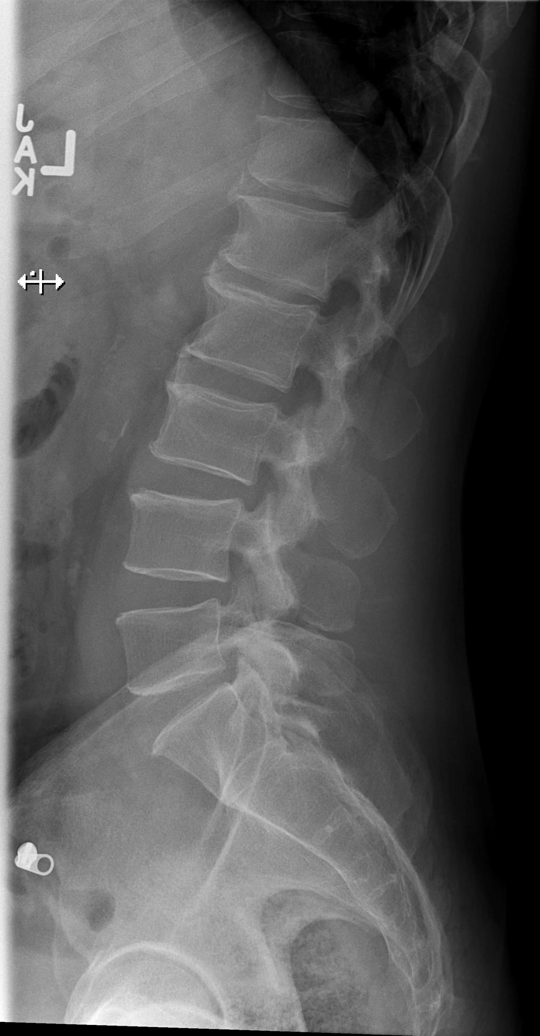

[t lumbar l-5 s-1 spot]
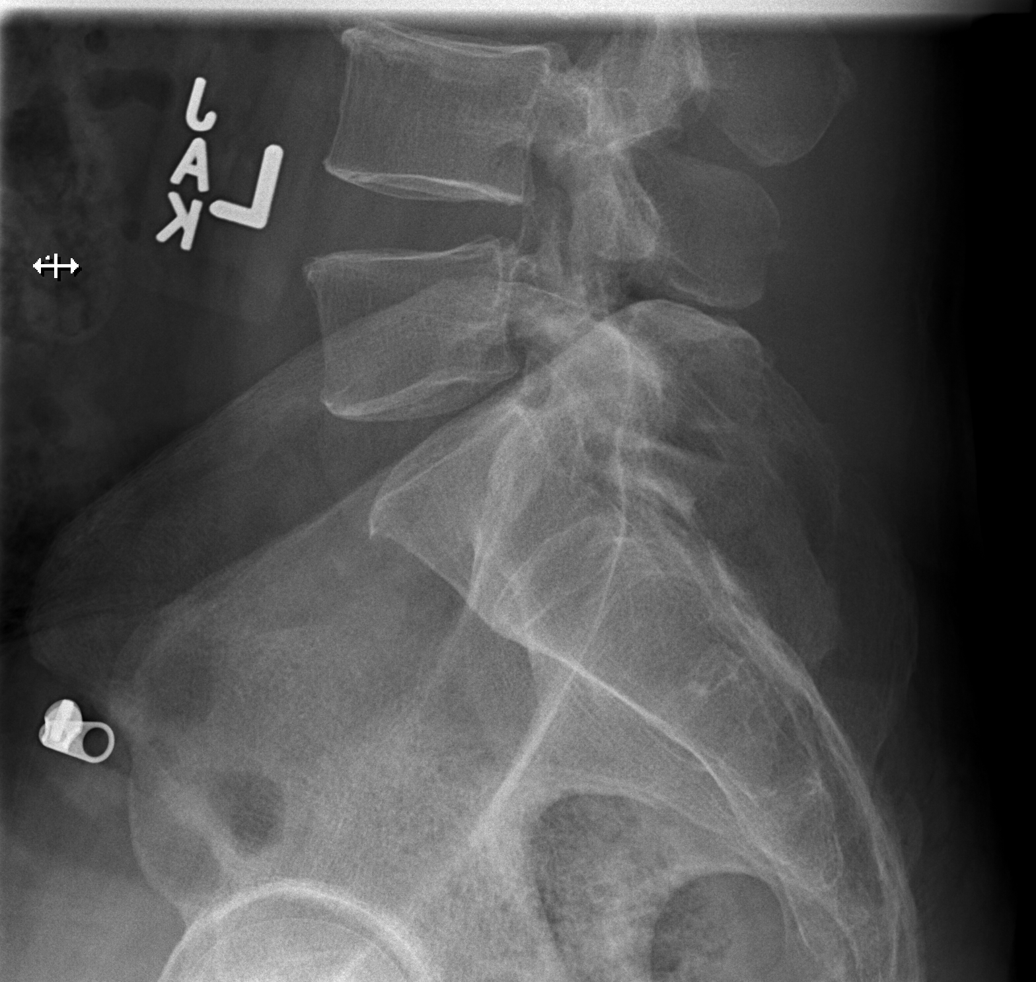

[5 of 5 positions shown; findings below may reference images not displayed]

FINDINGS: No fracture or spondylolisthesis is noted. Mild degenerative disc
disease is noted at L1-2 with anterior osteophyte formation.
Anterior osteophyte formation is also noted at L2-3. Remaining disc
spaces are unremarkable.
IMPRESSION: Mild degenerative disc disease is noted at L1-2 and L2-3. No acute
abnormality seen in the lumbar spine.

## 2021-09-09 ENCOUNTER — Other Ambulatory Visit: Payer: Self-pay | Admitting: Internal Medicine

## 2021-09-09 DIAGNOSIS — E782 Mixed hyperlipidemia: Secondary | ICD-10-CM

## 2021-09-11 ENCOUNTER — Encounter: Payer: Managed Care, Other (non HMO) | Admitting: Internal Medicine

## 2021-10-29 ENCOUNTER — Encounter: Payer: Self-pay | Admitting: Internal Medicine

## 2021-10-29 NOTE — Progress Notes (Signed)
Future Appointments  Date Time Provider Department  10/30/2021  2:00 PM Lucky Cowboy, MD [facility]    History of Present Illness:     This very nice 56 y.o. 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 Currently is [place] in [place] & usually works 12-14 hour days. As a consequence, he has become emotionally labile, occasionally with crying spells & some times having explosive outbursts toward his wife. He senies any SI or HI.Marland Kitchen He is amenable to try a low dose of Lexapro & and also will use low dose Alprazolam til the Lexapro has a chance to become effective.   Medications   Current Outpatient Medications (Cardiovascular):    rosuvastatin (CRESTOR) 20 MG tablet, TAKE 1 TABLET BY MOUTH EVERY DAY FOR CHOLESTEROL   Current Outpatient Medications (Analgesics):    indomethacin (INDOCIN) 50 MG capsule, TAKE 1 CAPSULE 3 X /DAY WITH MEALS IF NEEDED FOR GOUT PAIN   allopurinol (ZYLOPRIM) 300 MG tablet, Take 1 tablet Daily to Prevent Gout   Current Outpatient Medications (Other):    ALPRAZolam (XANAX) 0.5 MG tablet, Take  1/2 to 1 tablet  2 to 3 x /day  as needed for Anxiety   baclofen (LIORESAL) 10 MG tablet, TAKE 1/2 TO 1 TABLET AT BEDTIME IF NEEDED FOR MUSCLESPASM   escitalopram (LEXAPRO) 20 MG tablet, Take 1 tablet Daily for Mood   topiramate (TOPAMAX) 50 MG tablet, TAKE 1/2 TO 1 TABLET 2 X /DAY AT SUPPERTIME & BEDTIME FOR DIETING & WEIGHT LOSS   phentermine (ADIPEX-P) 37.5 MG tablet, Take 1/2 to 1 tablet every Morning for Dieting & Weight Loss  Problem list He has Gout and Seasonal allergies on their problem list.   Observations/Objective:  BP 133/88   Pulse 92   Temp (!) 96.9 F (36.1 C)   Resp 16   Ht 5' 7.5" (1.715 m)   Wt 180 lb (81.6 kg)   SpO2 99%   BMI 27.78 kg/m   HEENT - WNL. Neck -  supple.  Chest - Clear equal BS. Cor - Nl HS. RRR w/o sig MGR. PP 1(+). No edema. MS- FROM w/o deformities.  Gait Nl. Neuro -  Nl w/o focal abnormalities.   Assessment and Plan:  1. Elevated BP without diagnosis of hypertension   2. Class 2 severe obesity due to excess calories with serious comorbidity in adult,(HTN)  (HCC)  - phentermine 37.5 MG tablet;  Take 1/2 to 1 tablet every Morning for Dieting & Weight Loss    Dispense: 90 tablet; Refill: 1  3. Anxiety states  - escitalopram (LEXAPRO) 20 MG tablet;  Take 1 tablet Daily for Mood   Dispense: 90 tablet; Refill: 3  - ALPRAZolam (XANAX) 0.5 MG tablet; Take  1/2 to 1 tablet  2 to 3 x /day  as needed for Anxiety  Dispense: 90 tablet; Refill: 0  4. Chronic idiopathic gout involving toe of right foot without tophus - allopurinol (ZYLOPRIM) 300 MG tablet; Take 1 tablet Daily to Prevent Gout  Dispense: 90 tablet; Refill: 3   Follow Up Instructions:       I discussed the assessment and treatment plan with the patient. The patient was provided an opportunity to ask questions and all were answered. The patient agreed with the plan and demonstrated an understanding of the instructions. Advised to  ROV in  1 month        The patient was advised to call back or seek an in-person evaluation if the symptoms worsen or if the condition fails to improve as anticipated.    Marinus Maw, MD

## 2021-10-30 ENCOUNTER — Ambulatory Visit: Payer: Managed Care, Other (non HMO) | Admitting: Internal Medicine

## 2021-10-30 ENCOUNTER — Encounter: Payer: Self-pay | Admitting: Internal Medicine

## 2021-10-30 VITALS — BP 133/88 | HR 92 | Temp 96.9°F | Resp 16 | Ht 67.5 in | Wt 180.0 lb

## 2021-10-30 DIAGNOSIS — F411 Generalized anxiety disorder: Secondary | ICD-10-CM | POA: Diagnosis not present

## 2021-10-30 DIAGNOSIS — R030 Elevated blood-pressure reading, without diagnosis of hypertension: Secondary | ICD-10-CM | POA: Diagnosis not present

## 2021-10-30 DIAGNOSIS — M1A0710 Idiopathic chronic gout, right ankle and foot, without tophus (tophi): Secondary | ICD-10-CM | POA: Diagnosis not present

## 2021-10-30 MED ORDER — ALLOPURINOL 300 MG PO TABS: ORAL_TABLET | ORAL | 3 refills | Status: DC

## 2021-10-30 MED ORDER — ESCITALOPRAM OXALATE 20 MG PO TABS: ORAL_TABLET | ORAL | 3 refills | Status: DC

## 2021-10-30 MED ORDER — PHENTERMINE HCL 37.5 MG PO TABS: ORAL_TABLET | ORAL | 1 refills | Status: DC

## 2021-10-30 MED ORDER — ALPRAZOLAM 0.5 MG PO TABS: ORAL_TABLET | ORAL | 0 refills | Status: DC

## 2021-12-05 ENCOUNTER — Encounter: Payer: Self-pay | Admitting: Internal Medicine

## 2021-12-05 ENCOUNTER — Ambulatory Visit: Payer: Managed Care, Other (non HMO) | Admitting: Internal Medicine

## 2021-12-05 VITALS — BP 140/80 | HR 70 | Temp 97.9°F | Resp 16 | Ht 67.5 in | Wt 180.6 lb

## 2021-12-05 DIAGNOSIS — Z79899 Other long term (current) drug therapy: Secondary | ICD-10-CM

## 2021-12-05 DIAGNOSIS — R7309 Other abnormal glucose: Secondary | ICD-10-CM

## 2021-12-05 DIAGNOSIS — R030 Elevated blood-pressure reading, without diagnosis of hypertension: Secondary | ICD-10-CM

## 2021-12-05 DIAGNOSIS — E782 Mixed hyperlipidemia: Secondary | ICD-10-CM | POA: Diagnosis not present

## 2021-12-05 DIAGNOSIS — E7439 Other disorders of intestinal carbohydrate absorption: Secondary | ICD-10-CM | POA: Diagnosis not present

## 2021-12-05 DIAGNOSIS — E559 Vitamin D deficiency, unspecified: Secondary | ICD-10-CM

## 2021-12-05 DIAGNOSIS — M1A0710 Idiopathic chronic gout, right ankle and foot, without tophus (tophi): Secondary | ICD-10-CM | POA: Diagnosis not present

## 2021-12-05 NOTE — Progress Notes (Unsigned)
    Future Appointments  Date Time Provider Department  12/05/2021 10:30 AM Lucky Cowboy, MD [facility]    History of Present Illness:   This very nice 56 y.o. 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 Currently is working in the [place] area & usually works 12-14 hour days. As a consequence, he has become emotionally labile, occasionally with crying spells & some times having explosive outbursts toward his wife.       low dose of Lexapro & and also will use low dose Alprazolam til the Lexapro has a chance to become effective.   Medications   Current Outpatient Medications (Cardiovascular):    rosuvastatin (CRESTOR) 20 MG tablet, TAKE 1 TABLET BY MOUTH EVERY DAY FOR CHOLESTEROL   Current Outpatient Medications (Analgesics):    allopurinol (ZYLOPRIM) 300 MG tablet, Take 1 tablet Daily to Prevent Gout   indomethacin (INDOCIN) 50 MG capsule, TAKE 1 CAPSULE 3 X /DAY WITH MEALS IF NEEDED FOR GOUT PAIN   Current Outpatient Medications (Other):    ALPRAZolam (XANAX) 0.5 MG tablet, Take  1/2 to 1 tablet  2 to 3 x /day  as needed for Anxiety   baclofen (LIORESAL) 10 MG tablet, TAKE 1/2 TO 1 TABLET AT BEDTIME IF NEEDED FOR MUSCLESPASM   escitalopram (LEXAPRO) 20 MG tablet, Take 1 tablet Daily for Mood   phentermine (ADIPEX-P) 37.5 MG tablet, Take 1/2 to 1 tablet every Morning for Dieting & Weight Loss   topiramate (TOPAMAX) 50 MG tablet, TAKE 1/2 TO 1 TABLET 2 X /DAY AT SUPPERTIME & BEDTIME FOR DIETING & WEIGHT LOSS  Problem list He has Gout and Seasonal allergies on their problem list.   Observations/Objective:  There were no vitals taken for this visit.  HEENT - WNL. Neck - supple.  Chest - Clear equal BS. Cor - Nl HS. RRR w/o sig MGR. PP 1(+). No edema. MS- FROM w/o deformities.  Gait Nl. Neuro -  Nl w/o focal  abnormalities.   Assessment and Plan:      Follow Up Instructions:        I discussed the assessment and treatment plan with the patient. The patient was provided an opportunity to ask questions and all were answered. The patient agreed with the plan and demonstrated an understanding of the instructions.       The patient was advised to call back or seek an in-person evaluation if the symptoms worsen or if the condition fails to improve as anticipated.    Alexander Maw, MD

## 2021-12-05 NOTE — Patient Instructions (Signed)

## 2021-12-08 LAB — CBC WITH DIFFERENTIAL/PLATELET
Absolute Monocytes: 421 cells/uL (ref 200–950)
Basophils Absolute: 38 cells/uL (ref 0–200)
Basophils Relative: 0.7 %
Eosinophils Absolute: 70 cells/uL (ref 15–500)
Eosinophils Relative: 1.3 %
HCT: 53.1 % — ABNORMAL HIGH (ref 38.5–50.0)
Hemoglobin: 18.3 g/dL — ABNORMAL HIGH (ref 13.2–17.1)
Lymphs Abs: 1534 cells/uL (ref 850–3900)
MCH: 32.3 pg (ref 27.0–33.0)
MCHC: 34.5 g/dL (ref 32.0–36.0)
MCV: 93.8 fL (ref 80.0–100.0)
MPV: 10.2 fL (ref 7.5–12.5)
Monocytes Relative: 7.8 %
Neutro Abs: 3337 cells/uL (ref 1500–7800)
Neutrophils Relative %: 61.8 %
Platelets: 195 10*3/uL (ref 140–400)
RBC: 5.66 10*6/uL (ref 4.20–5.80)
RDW: 12.6 % (ref 11.0–15.0)
Total Lymphocyte: 28.4 %
WBC: 5.4 10*3/uL (ref 3.8–10.8)

## 2021-12-08 LAB — COMPLETE METABOLIC PANEL WITH GFR
AG Ratio: 1.8 (calc) (ref 1.0–2.5)
ALT: 49 U/L — ABNORMAL HIGH (ref 9–46)
AST: 26 U/L (ref 10–35)
Albumin: 4.8 g/dL (ref 3.6–5.1)
Alkaline phosphatase (APISO): 95 U/L (ref 35–144)
BUN/Creatinine Ratio: 8 (calc) (ref 6–22)
BUN: 11 mg/dL (ref 7–25)
CO2: 30 mmol/L (ref 20–32)
Calcium: 10.3 mg/dL (ref 8.6–10.3)
Chloride: 104 mmol/L (ref 98–110)
Creat: 1.36 mg/dL — ABNORMAL HIGH (ref 0.70–1.30)
Globulin: 2.7 g/dL (calc) (ref 1.9–3.7)
Glucose, Bld: 103 mg/dL — ABNORMAL HIGH (ref 65–99)
Potassium: 5.3 mmol/L (ref 3.5–5.3)
Sodium: 141 mmol/L (ref 135–146)
Total Bilirubin: 0.8 mg/dL (ref 0.2–1.2)
Total Protein: 7.5 g/dL (ref 6.1–8.1)
eGFR: 62 mL/min/{1.73_m2} (ref >=60)

## 2021-12-08 LAB — VITAMIN D 25 HYDROXY (VIT D DEFICIENCY, FRACTURES): Vit D, 25-Hydroxy: 54 ng/mL (ref 30–100)

## 2021-12-08 LAB — LIPID PANEL
Cholesterol: 160 mg/dL (ref <200)
HDL: 58 mg/dL (ref >=40)
LDL Cholesterol (Calc): 78 mg/dL (calc)
Non-HDL Cholesterol (Calc): 102 mg/dL (calc) (ref <130)
Total CHOL/HDL Ratio: 2.8 (calc) (ref <5.0)
Triglycerides: 147 mg/dL (ref <150)

## 2021-12-08 LAB — INSULIN, RANDOM: Insulin: 15 u[IU]/mL

## 2021-12-08 LAB — HEMOGLOBIN A1C
Hgb A1c MFr Bld: 5.1 % of total Hgb (ref <5.7)
Mean Plasma Glucose: 100 mg/dL
eAG (mmol/L): 5.5 mmol/L

## 2021-12-08 LAB — TSH: TSH: 0.32 mIU/L — ABNORMAL LOW (ref 0.40–4.50)

## 2021-12-08 LAB — URIC ACID: Uric Acid, Serum: 4.3 mg/dL (ref 4.0–8.0)

## 2021-12-08 LAB — MAGNESIUM: Magnesium: 2.5 mg/dL (ref 1.5–2.5)

## 2022-04-03 ENCOUNTER — Encounter: Payer: Managed Care, Other (non HMO) | Admitting: Internal Medicine

## 2022-04-27 ENCOUNTER — Other Ambulatory Visit: Payer: Self-pay | Admitting: Internal Medicine

## 2022-04-27 ENCOUNTER — Encounter: Payer: Self-pay | Admitting: Internal Medicine

## 2022-04-27 DIAGNOSIS — F411 Generalized anxiety disorder: Secondary | ICD-10-CM

## 2022-04-27 MED ORDER — ESCITALOPRAM OXALATE 20 MG PO TABS: ORAL_TABLET | ORAL | 3 refills | Status: DC

## 2022-04-28 ENCOUNTER — Other Ambulatory Visit: Payer: Self-pay | Admitting: Internal Medicine

## 2022-04-28 DIAGNOSIS — F411 Generalized anxiety disorder: Secondary | ICD-10-CM

## 2022-04-29 ENCOUNTER — Other Ambulatory Visit: Payer: Self-pay | Admitting: Internal Medicine

## 2022-04-29 MED ORDER — ALPRAZOLAM 0.5 MG PO TABS: ORAL_TABLET | ORAL | 0 refills | Status: DC

## 2022-05-01 ENCOUNTER — Encounter: Payer: Managed Care, Other (non HMO) | Admitting: Internal Medicine

## 2022-05-11 NOTE — Progress Notes (Unsigned)
THIS ENCOUNTER IS A VIRTUAL VISIT DUE TO Flu A - PATIENT WAS NOT SEEN IN THE OFFICE.  PATIENT HAS CONSENTED TO VIRTUAL VISIT / TELEMEDICINE VISIT   Virtual Visit via telephone Note  I connected with  Alexander Logan on 05/12/2022 by telephone.  I verified that I am speaking with the correct person using two identifiers.    I discussed the limitations of evaluation and management by telemedicine and the availability of in person appointments. The patient expressed understanding and agreed to proceed.  History of Present Illness:  Pulse 89   Temp (!) 97 F (36.1 C)   Ht 5' 7.5" (1.715 m)   SpO2 94%   BMI 27.87 kg/m  56 y.o. patient contacted office reporting URI sx . he tested positive by Flu A. OV was conducted by telephone to minimize exposure. He did not get the flu shot  this year.  Sx Logan 3 days ago with Productive cough of green mucus, lots of head congestion, headache, fever, body aches  Treatments tried so far: Ibuprofen  Exposures: unknown   Medications   Current Outpatient Medications (Cardiovascular):    rosuvastatin (CRESTOR) 20 MG tablet, TAKE 1 TABLET BY MOUTH EVERY DAY FOR CHOLESTEROL  Current Outpatient Medications (Respiratory):    guaiFENesin (MUCINEX PO), Take by mouth.   Pseudoephedrine HCl (SUDAFED PO), Take by mouth.  Current Outpatient Medications (Analgesics):    allopurinol (ZYLOPRIM) 300 MG tablet, Take 1 tablet Daily to Prevent Gout   IBUPROFEN PO, Take by mouth.   indomethacin (INDOCIN) 50 MG capsule, TAKE 1 CAPSULE 3 X /DAY WITH MEALS IF NEEDED FOR GOUT PAIN   Current Outpatient Medications (Other):    ALPRAZolam (XANAX) 0.5 MG tablet, Take  1/2 to 1 tablet  2 to 3 x /day  as needed for Anxiety   baclofen (LIORESAL) 10 MG tablet, TAKE 1/2 TO 1 TABLET AT BEDTIME IF NEEDED FOR MUSCLESPASM   escitalopram (LEXAPRO) 20 MG tablet, Take 1 & 1/2  tablet (30 mg) Daily for Mood   phentermine (ADIPEX-P) 37.5 MG tablet, Take 1/2 to 1 tablet every  Morning for Dieting & Weight Loss   topiramate (TOPAMAX) 50 MG tablet, TAKE 1/2 TO 1 TABLET 2 X /DAY AT SUPPERTIME & BEDTIME FOR DIETING & WEIGHT LOSS  Allergies: No Known Allergies  Problem list He has Gout and Seasonal allergies on their problem list.   Social History:   reports that he has never smoked. He has never used smokeless tobacco. He reports that he does not drink alcohol and does not use drugs.  Observations/Objective:  General : Well sounding patient in no apparent distress HEENT: no hoarseness, no cough for duration of visit Lungs: speaks in complete sentences, no audible wheezing, no apparent distress Neurological: alert, oriented x 3 Psychiatric: pleasant, judgement appropriate   Assessment and Plan:  Flu A Flu A positive per rapid screening test in parking lot of office Symptoms are: moderate Immue support reviewed  Take tylenol PRN temp 101+ Push hydration Regular ambulation or calf exercises exercises for clot prevention and 81 mg ASA unless contraindicated Sx supportive therapy suggested Follow up via mychart or telephone if needed Advised patient obtain O2 monitor; present to ED if persistently <90% or with severe dyspnea, CP, fever uncontrolled by tylenol, confusion, sudden decline   Flu-like symptoms -     POCT Influenza A/B- Positive Flu A  Encounter for screening for COVID-19 -     POC COVID-19- Negative  Flu Push fluids Use ibuprofen for  body aches and fever Use Tylenol #3 to help control cough- he is getting no relief from Promethazine DM -     azithromycin (ZITHROMAX) 250 MG tablet; Take 2 tablets (500 mg) on  Day 1,  followed by 1 tablet (250 mg) once daily on Days 2 through 5. -     albuterol (VENTOLIN HFA) 108 (90 Base) MCG/ACT inhaler; Inhale 2 puffs into the lungs every 6 (six) hours as needed for wheezing or shortness of breath. -     acetaminophen-codeine (TYLENOL #3) 300-30 MG tablet; Take 1-2 tablets by mouth every 4 (four) hours as  needed for moderate pain.      Follow Up Instructions:  I discussed the assessment and treatment plan with the patient. The patient was provided an opportunity to ask questions and all were answered. The patient agreed with the plan and demonstrated an understanding of the instructions.   The patient was advised to call back or seek an in-person evaluation if the symptoms worsen or if the condition fails to improve as anticipated.  I provided 20 minutes of non-face-to-face time during this encounter.   Raynelle Dick, NP

## 2022-05-12 ENCOUNTER — Other Ambulatory Visit: Payer: Self-pay

## 2022-05-12 ENCOUNTER — Ambulatory Visit: Payer: Managed Care, Other (non HMO) | Admitting: Nurse Practitioner

## 2022-05-12 ENCOUNTER — Encounter: Payer: Self-pay | Admitting: Nurse Practitioner

## 2022-05-12 VITALS — HR 89 | Temp 97.0°F | Ht 67.5 in

## 2022-05-12 DIAGNOSIS — J111 Influenza due to unidentified influenza virus with other respiratory manifestations: Secondary | ICD-10-CM

## 2022-05-12 DIAGNOSIS — R6889 Other general symptoms and signs: Secondary | ICD-10-CM | POA: Diagnosis not present

## 2022-05-12 DIAGNOSIS — Z1152 Encounter for screening for COVID-19: Secondary | ICD-10-CM | POA: Diagnosis not present

## 2022-05-12 LAB — POCT INFLUENZA A/B
Influenza A, POC: POSITIVE — AB
Influenza B, POC: NEGATIVE

## 2022-05-12 LAB — POC COVID19 BINAXNOW: SARS Coronavirus 2 Ag: NEGATIVE

## 2022-05-12 MED ORDER — ALBUTEROL SULFATE HFA 108 (90 BASE) MCG/ACT IN AERS: 2.0000 | INHALATION_SPRAY | Freq: Four times a day (QID) | RESPIRATORY_TRACT | 2 refills | Status: DC | PRN

## 2022-05-12 MED ORDER — ACETAMINOPHEN-CODEINE 300-30 MG PO TABS: 1.0000 | ORAL_TABLET | ORAL | 0 refills | Status: DC | PRN

## 2022-05-12 MED ORDER — AZITHROMYCIN 250 MG PO TABS: ORAL_TABLET | ORAL | 1 refills | Status: DC

## 2022-05-12 NOTE — Patient Instructions (Signed)

## 2022-05-14 ENCOUNTER — Other Ambulatory Visit: Payer: Self-pay

## 2022-05-14 DIAGNOSIS — J111 Influenza due to unidentified influenza virus with other respiratory manifestations: Secondary | ICD-10-CM

## 2022-05-14 MED ORDER — ALBUTEROL SULFATE HFA 108 (90 BASE) MCG/ACT IN AERS: 1.0000 | INHALATION_SPRAY | Freq: Four times a day (QID) | RESPIRATORY_TRACT | 2 refills | Status: DC | PRN

## 2022-05-15 ENCOUNTER — Encounter: Payer: Self-pay | Admitting: Nurse Practitioner

## 2022-05-15 DIAGNOSIS — J111 Influenza due to unidentified influenza virus with other respiratory manifestations: Secondary | ICD-10-CM

## 2022-05-16 MED ORDER — ALBUTEROL SULFATE HFA 108 (90 BASE) MCG/ACT IN AERS: 1.0000 | INHALATION_SPRAY | Freq: Four times a day (QID) | RESPIRATORY_TRACT | 2 refills | Status: DC | PRN

## 2022-05-29 ENCOUNTER — Encounter: Payer: Managed Care, Other (non HMO) | Admitting: Internal Medicine

## 2022-06-04 NOTE — Progress Notes (Unsigned)
Annual  Screening/Preventative Visit  & Comprehensive Evaluation & Examination   Future Appointments  Date Time Provider Department  06/05/2022 11:00 AM Unk Pinto, MD [facility]  06/08/2023 11:00 AM Unk Pinto, MD [facility]                                            This very nice 57 y.o. MWM with hx/o BP, HLD, Pre-Diabetes,  Gout and Vitamin D Deficiency  presents for a Screening /Preventative Visit & comprehensive evaluation.        Patient has been monitored expectantly for labile elevated BP'. Patient's BP has also  been controlled and today's BP is at goal - 130/80 . Patient denies any cardiac symptoms as chest pain, palpitations, shortness of breath, dizziness or ankle swelling.          Hyperlipidemia is controlled with diet  and rosuvastatin. In Apr 2022, T Chol = 269 , Tg 390, LDL 158 & he was started on Rosuvastatin.  Last Lipids were at goal :  Lab Results  Component Value Date   CHOL 160 12/05/2021   HDL 58 12/05/2021   LDLCALC 78 12/05/2021   TRIG 147 12/05/2021   CHOLHDL 2.8 12/05/2021        Patient is monitored expectantly for glucose intolerance.   In Apr  A1c = 5.2%  with elevated Insulin 111 suspect for Insulin Resistance .Patient denies reactive hypoglycemic symptoms, visual blurring, diabetic polys or paresthesias.   Lab Results  Component Value Date   HGBA1C 5.6 06/05/2022         Finally, patient has hx/o low Vit D  ( "23" - 08/2020) .  Lab Results  Component Value Date   VD25OH 30 06/05/2022        Patient has hx/o low Vit B12 ( "277" - 08/2020)    Current Outpatient Medications  Medication Instructions   TYLENOL #3  1-2 tablets Every 4 hours PRN   albuterol HFA inhaler 1-2 puffs Every 6 hours PRN   allopurinol 300 MG tablet Take 1 tablet Daily to Prevent Gout   ALPRAZolam (XANAX) 0.5 MG tablet Take  1/2 to 1 tablet  2 to 3 x /day  as needed for Anxiety   baclofen (LIORESAL) 10 MG tablet TAKE 1/2 TO 1 TABLET AT BEDTIME  IF NEEDED    LEXAPRO 20 MG tablet Take 1 & 1/2  tablet (30 mg) Daily for Mood   IBUPROFEN PO Oral   indomethacin  50 MG capsule TAKE 1 CAPSULE 3 X /DAY WITH MEALS IF NEEDED    phentermine 37.5 MG tablet Take 1/2 to 1 tablet every Morning for Dieting & Weight Loss   Pseudoephedrine  As needed for congestion   rosuvastatin 20 MG tablet TAKE 1 TABLET EVERY DAY    topiramate 50 MG tablet TAKE 1/2 TO 1 TABLET 2 X /DAY     No Known Allergies   Past Medical History:  Diagnosis Date   Gout     Health Maintenance  Topic Date Due   Hepatitis C Screening  Never done   HIV Screening  Never done   TETANUS/TDAP  Never done   COLONOSCOPY  Never done   COVID-19 Vaccine (2 - Pfizer 3-dose series) 07/20/2020   INFLUENZA VACCINE  12/23/2020   HPV VACCINES  Aged Out    Immunization History  Administered Date(s) Administered  Influenza Inj Mdck Quad  04/05/2017   Influenza,inj,quad 03/20/2017   Influenza 03/20/2017   PFIZER Covid-19 Vaccine 06/29/2020    Last Colon - 2018 Colonoscopy   Cologard - 09/09/2020 - Never returned  Past Surgical History:  Procedure Laterality Date   KNEE ARTHROSCOPY W/ MENISCECTOMY Left 2014    Family History  Problem Relation Age of Onset   Diabetes Father     Social History   Socioeconomic History   Marital status: Married    Spouse name: Darl Pikes   Number of children: Twin sons 49 yo & 2 GC  Occupational History  Tobacco Use   Smoking status: Never Smoker   Smokeless tobacco: Never Used  Substance and Sexual Activity   Alcohol use: No   Drug use: No   Sexual activity: Yes   ROS Constitutional: Denies fever, chills, weight loss/gain, headaches, insomnia,  night sweats or change in appetite. Does c/o fatigue. Eyes: Denies redness, blurred vision, diplopia, discharge, itchy or watery eyes.  ENT: Denies discharge, congestion, post nasal drip, epistaxis, sore throat, earache, hearing loss, dental pain, Tinnitus, Vertigo, Sinus pain or snoring.   Cardio: Denies chest pain, palpitations, irregular heartbeat, syncope, dyspnea, diaphoresis, orthopnea, PND, claudication or edema Respiratory: denies cough, dyspnea, DOE, pleurisy, hoarseness, laryngitis or wheezing.  Gastrointestinal: Denies dysphagia, heartburn, reflux, water brash, pain, cramps, nausea, vomiting, bloating, diarrhea, constipation, hematemesis, melena, hematochezia, jaundice or hemorrhoids Genitourinary: Denies dysuria, frequency, urgency, nocturia, hesitancy, discharge, hematuria or flank pain Musculoskeletal: Denies arthralgia, myalgia, stiffness, Jt. Swelling, pain, limp or strain/sprain. Denies Falls. Skin: Denies puritis, rash, hives, warts, acne, eczema or change in skin lesion Neuro: No weakness, tremor, incoordination, spasms, paresthesia or pain Psychiatric: Denies confusion, memory loss or sensory loss. Denies Depression. Endocrine: Denies change in weight, skin, hair change, nocturia, and paresthesia, diabetic polys, visual blurring or hyper / hypo glycemic episodes.  Heme/Lymph: No excessive bleeding, bruising or enlarged lymph nodes.  Physical Exam  BP 130/80   Pulse 92   Temp 97.9 F (36.6 C)   Resp 16   Ht 5' 7.5" (1.715 m)   Wt 187 lb (84.8 kg)   SpO2 98%   BMI 28.86 kg/m   General Appearance: Well nourished and well groomed and in no apparent distress.  Eyes: PERRLA, EOMs, conjunctiva no swelling or erythema, normal fundi and vessels. Sinuses: No frontal/maxillary tenderness ENT/Mouth: EACs patent / TMs  nl. Nares clear without erythema, swelling, mucoid exudates. Oral hygiene is good. No erythema, swelling, or exudate. Tongue normal, non-obstructing. Tonsils not swollen or erythematous. Hearing normal.  Neck: Supple, thyroid not palpable. No bruits, nodes or JVD. Respiratory: Respiratory effort normal.  BS equal and clear bilateral without rales, rhonci, wheezing or stridor. Cardio: Heart sounds are normal with regular rate and rhythm and no  murmurs, rubs or gallops. Peripheral pulses are normal and equal bilaterally without edema. No aortic or femoral bruits. Chest: symmetric with normal excursions and percussion.  Abdomen: Soft, with Nl bowel sounds. Nontender, no guarding, rebound, hernias, masses, or organomegaly.  Lymphatics: Non tender without lymphadenopathy.  Musculoskeletal: Full ROM all peripheral extremities, joint stability, 5/5 strength, and normal gait. Skin: Warm and dry without rashes, lesions, cyanosis, clubbing or  ecchymosis.  Neuro: Cranial nerves intact, reflexes equal bilaterally. Normal muscle tone, no cerebellar symptoms. Sensation intact.  Pysch: Alert and oriented X 3 with normal affect, insight and judgment appropriate.   Assessment and Plan  1. Annual Preventative/Screening Exam    2. Elevated BP without diagnosis of hypertension  -  EKG 12-Lead - [place], RETROPERITNL ABD,  LTD - Urinalysis, Routine w reflex microscopic - Microalbumin / creatinine urine ratio - CBC with Differential/Platelet - COMPLETE METABOLIC PANEL WITH GFR - Magnesium - TSH  3. Hyperlipidemia, mixed  - EKG 12-Lead - [place], RETROPERITNL ABD,  LTD - Lipid panel - TSH  4. Glucose intolerance  - EKG 12-Lead - [place], RETROPERITNL ABD,  LTD - Hemoglobin A1c - Insulin, random  5. Vitamin B12 deficiency  - Vitamin B12 - CBC with Differential/Platelet  6. Vitamin D deficiency  - VITAMIN D 25 Hydroxy (  7. Chronic idiopathic gout involving toe of right foot without tophus  - Uric acid  8. Prostate cancer screening  - PSA  9. Screening for colorectal cancer  - POC Hemoccult Bld/Stool  10. Screening-pulmonary TB  - TB Skin Test  11. Screening for heart disease  - EKG 12-Lead  12. FHx: heart disease  - EKG 12-Lead - [place], RETROPERITNL ABD,  LTD  13. Screening for AAA (aortic abdominal aneurysm)  - [place], RETROPERITNL ABD,  LTD - Iron, Total/Total Iron Binding Cap - Vitamin B12 - Testosterone - Uric  acid  14. Fatigue   15. Medication management  - Urinalysis, Routine w reflex microscopic - Microalbumin / creatinine urine ratio - Uric acid - CBC with Differential/Platelet - COMPLETE METABOLIC PANEL WITH GFR - Magnesium - Lipid panel - TSH - Hemoglobin A1c - Insulin, random - VITAMIN D 25 Hydroxy           control to achieve/maintain BMI less than 25, BP monitoring, regular exercise and medications as discussed.  Discussed med effects and SE's. Routine screening labs and tests as requested with regular follow-up as recommended. Over 40 minutes of exam, counseling, chart review and high complex critical decision making was performed   Kirtland Bouchard, MD

## 2022-06-04 NOTE — Progress Notes (Incomplete)
Annual  Screening/Preventative Visit  & Comprehensive Evaluation & Examination   Future Appointments  Date Time Provider Department  06/05/2022 11:00 AM Unk Pinto, MD [facility]  06/08/2023 11:00 AM Unk Pinto, MD [facility]                                            This very nice 57 y.o. MWM with hx/o BP, HLD, Pre-Diabetes,  Gout and Vitamin D Deficiency  presents for a Screening /Preventative Visit & comprehensive evaluation.        Patient has been monitored expectantly for labile elevated BP'. Patient's BP has also  been controlled and today's BP is at goal -                      . Patient denies any cardiac symptoms as chest pain, palpitations, shortness of breath, dizziness or ankle swelling.          Hyperlipidemia is controlled with diet  and rosuvastatin. In Apr 2022, T Chol = 269 , Tg 390, LDL 158 & he was started on Rosuvastatin.  Last Lipids were at goal :  Lab Results  Component Value Date   CHOL 160 12/05/2021   HDL 58 12/05/2021   LDLCALC 78 12/05/2021   TRIG 147 12/05/2021   CHOLHDL 2.8 12/05/2021        Patient is monitored expectantly for glucose intolerance.   In Apr  A1c = 5.2%  with elevated Insulin 111 suspect for Insulin Resistance .Patient denies reactive hypoglycemic symptoms, visual blurring, diabetic polys or paresthesias.       Finally, patient has hx/o low Vit D  ( "23" - 08/2020) .  Patient has hx/o low Vit B12 ( "277" - 08/2020)    Current Outpatient Medications  Medication Instructions  . acetaminophen-codeine (TYLENOL #3) 300-30 MG tablet 1-2 tablets, Oral, Every 4 hours PRN  . albuterol HFA inhaler 1-2 puffs Every 6 hours PRN  . allopurinol 300 MG tablet Take 1 tablet Daily to Prevent Gout  . ALPRAZolam (XANAX) 0.5 MG tablet Take  1/2 to 1 tablet  2 to 3 x /day  as needed for Anxiety  . baclofen (LIORESAL) 10 MG tablet TAKE 1/2 TO 1 TABLET AT BEDTIME IF NEEDED FOR MUSCLESPASM  . LEXAPRO 20 MG tablet Take 1 & 1/2  tablet  (30 mg) Daily for Mood  . IBUPROFEN PO Oral  . indomethacin (INDOCIN) 50 MG capsule TAKE 1 CAPSULE 3 X /DAY WITH MEALS IF NEEDED FOR GOUT PAIN  . phentermine 37.5 MG tablet Take 1/2 to 1 tablet every Morning for Dieting & Weight Loss  . Pseudoephedrine HCl (SUDAFED PO) Oral  . rosuvastatin (CRESTOR) 20 MG tablet TAKE 1 TABLET EVERY DAY   . topiramate (TOPAMAX) 50 MG tablet TAKE 1/2 TO 1 TABLET 2 X /DAY AT SUPPERTIME & BEDTIME FOR DIETING & WEIGHT LOSS      No Known Allergies  Past Medical History:  Diagnosis Date  . Gout     Health Maintenance  Topic Date Due  . Hepatitis C Screening  Never done  . HIV Screening  Never done  . TETANUS/TDAP  Never done  . COLONOSCOPY  Never done  . COVID-19 Vaccine (2 - Pfizer 3-dose series) 07/20/2020  . INFLUENZA VACCINE  12/23/2020  . HPV VACCINES  Aged Out    Immunization History  Administered Date(s) Administered  . Influenza Inj Mdck Quad  04/05/2017  . Influenza,inj,quad 03/20/2017  . Influenza 03/20/2017  . PFIZER Covid-19 Vaccine 06/29/2020    Last Colon - 2018 Colonoscopy   Past Surgical History:  Procedure Laterality Date  . KNEE ARTHROSCOPY W/ MENISCECTOMY Left 2014    Family History  Problem Relation Age of Onset  . Diabetes Father     Social History   Socioeconomic History  . Marital status: Married    Spouse name: Manuela Schwartz  . Number of children: Twin sons 18 yo & 2 GC  Occupational History  Tobacco Use  . Smoking status: Never Smoker  . Smokeless tobacco: Never Used  Substance and Sexual Activity  . Alcohol use: No  . Drug use: No  . Sexual activity: Yes   ROS Constitutional: Denies fever, chills, weight loss/gain, headaches, insomnia,  night sweats or change in appetite. Does c/o fatigue. Eyes: Denies redness, blurred vision, diplopia, discharge, itchy or watery eyes.  ENT: Denies discharge, congestion, post nasal drip, epistaxis, sore throat, earache, hearing loss, dental pain, Tinnitus, Vertigo, Sinus  pain or snoring.  Cardio: Denies chest pain, palpitations, irregular heartbeat, syncope, dyspnea, diaphoresis, orthopnea, PND, claudication or edema Respiratory: denies cough, dyspnea, DOE, pleurisy, hoarseness, laryngitis or wheezing.  Gastrointestinal: Denies dysphagia, heartburn, reflux, water brash, pain, cramps, nausea, vomiting, bloating, diarrhea, constipation, hematemesis, melena, hematochezia, jaundice or hemorrhoids Genitourinary: Denies dysuria, frequency, urgency, nocturia, hesitancy, discharge, hematuria or flank pain Musculoskeletal: Denies arthralgia, myalgia, stiffness, Jt. Swelling, pain, limp or strain/sprain. Denies Falls. Skin: Denies puritis, rash, hives, warts, acne, eczema or change in skin lesion Neuro: No weakness, tremor, incoordination, spasms, paresthesia or pain Psychiatric: Denies confusion, memory loss or sensory loss. Denies Depression. Endocrine: Denies change in weight, skin, hair change, nocturia, and paresthesia, diabetic polys, visual blurring or hyper / hypo glycemic episodes.  Heme/Lymph: No excessive bleeding, bruising or enlarged lymph nodes.  Physical Exam  There were no vitals taken for this visit.  General Appearance: Well nourished and well groomed and in no apparent distress.  Eyes: PERRLA, EOMs, conjunctiva no swelling or erythema, normal fundi and vessels. Sinuses: No frontal/maxillary tenderness ENT/Mouth: EACs patent / TMs  nl. Nares clear without erythema, swelling, mucoid exudates. Oral hygiene is good. No erythema, swelling, or exudate. Tongue normal, non-obstructing. Tonsils not swollen or erythematous. Hearing normal.  Neck: Supple, thyroid not palpable. No bruits, nodes or JVD. Respiratory: Respiratory effort normal.  BS equal and clear bilateral without rales, rhonci, wheezing or stridor. Cardio: Heart sounds are normal with regular rate and rhythm and no murmurs, rubs or gallops. Peripheral pulses are normal and equal bilaterally  without edema. No aortic or femoral bruits. Chest: symmetric with normal excursions and percussion.  Abdomen: Soft, with Nl bowel sounds. Nontender, no guarding, rebound, hernias, masses, or organomegaly.  Lymphatics: Non tender without lymphadenopathy.  Musculoskeletal: Full ROM all peripheral extremities, joint stability, 5/5 strength, and normal gait. Skin: Warm and dry without rashes, lesions, cyanosis, clubbing or  ecchymosis.  Neuro: Cranial nerves intact, reflexes equal bilaterally. Normal muscle tone, no cerebellar symptoms. Sensation intact.  Pysch: Alert and oriented X 3 with normal affect, insight and judgment appropriate.   Assessment and Plan  1. Annual Preventative/Screening Exam    2. Hypertension screen  - EKG 12-Lead - [place], RETROPERITNL ABD,  LTD - COMPLETE METABOLIC PANEL WITH GFR - Magnesium  3. Lipid screening  - EKG 12-Lead - [place], RETROPERITNL ABD,  LTD - Lipid panel  4. Glucose intolerance  -  EKG 12-Lead - [place], RETROPERITNL ABD,  LTD - Hemoglobin A1c - Insulin, random  5. Vitamin D deficiency  - VITAMIN D 25 Hydroxy  6. Chronic idiopathic gout involving toe of right foot without tophus  - Uric acid  7. Prostate cancer screening  - PSA  8. Screening for colorectal cancer  - Cologuard  9. Screening-pulmonary TB  - TB Skin Test  10. Screening for ischemic heart disease  - EKG 12-Lead  11. FHx: heart disease  - EKG 12-Lead  12. Screening for AAA (aortic abdominal aneurysm)  - [place], RETROPERITNL ABD,  LTD  13. Fatigue, unspecified type  - Vitamin B12 - Testosterone - TB Skin Test - CBC with Differential/Platelet - TSH  14. Medication management  - Urinalysis, Routine w reflex microscopic - Microalbumin / creatinine urine ratio - Uric acid - CBC with Differential/Platelet - COMPLETE METABOLIC PANEL WITH GFR - Magnesium - Lipid panel - TSH - Hemoglobin A1c - Insulin, random - VITAMIN D 25 Hydroxy          control to  achieve/maintain BMI less than 25, BP monitoring, regular exercise and medications as discussed.  Discussed med effects and SE's. Routine screening labs and tests as requested with regular follow-up as recommended. Over 40 minutes of exam, counseling, chart review and high complex critical decision making was performed   Marinus Maw, MD

## 2022-06-05 ENCOUNTER — Encounter: Payer: Self-pay | Admitting: Internal Medicine

## 2022-06-05 ENCOUNTER — Ambulatory Visit (INDEPENDENT_AMBULATORY_CARE_PROVIDER_SITE_OTHER): Payer: Managed Care, Other (non HMO) | Admitting: Internal Medicine

## 2022-06-05 VITALS — BP 130/80 | HR 92 | Temp 97.9°F | Resp 16 | Ht 67.5 in | Wt 187.0 lb

## 2022-06-05 DIAGNOSIS — N401 Enlarged prostate with lower urinary tract symptoms: Secondary | ICD-10-CM | POA: Diagnosis not present

## 2022-06-05 DIAGNOSIS — Z1211 Encounter for screening for malignant neoplasm of colon: Secondary | ICD-10-CM

## 2022-06-05 DIAGNOSIS — Z131 Encounter for screening for diabetes mellitus: Secondary | ICD-10-CM

## 2022-06-05 DIAGNOSIS — K219 Gastro-esophageal reflux disease without esophagitis: Secondary | ICD-10-CM

## 2022-06-05 DIAGNOSIS — M1A0710 Idiopathic chronic gout, right ankle and foot, without tophus (tophi): Secondary | ICD-10-CM | POA: Diagnosis not present

## 2022-06-05 DIAGNOSIS — R350 Frequency of micturition: Secondary | ICD-10-CM | POA: Diagnosis not present

## 2022-06-05 DIAGNOSIS — Z130 Encounter for screening for diseases of the blood and blood-forming organs and certain disorders involving the immune mechanism: Secondary | ICD-10-CM

## 2022-06-05 DIAGNOSIS — I700 Atherosclerosis of aorta: Secondary | ICD-10-CM | POA: Diagnosis not present

## 2022-06-05 DIAGNOSIS — E7439 Other disorders of intestinal carbohydrate absorption: Secondary | ICD-10-CM

## 2022-06-05 DIAGNOSIS — Z125 Encounter for screening for malignant neoplasm of prostate: Secondary | ICD-10-CM

## 2022-06-05 DIAGNOSIS — E782 Mixed hyperlipidemia: Secondary | ICD-10-CM

## 2022-06-05 DIAGNOSIS — Z1389 Encounter for screening for other disorder: Secondary | ICD-10-CM

## 2022-06-05 DIAGNOSIS — Z13220 Encounter for screening for lipoid disorders: Secondary | ICD-10-CM | POA: Diagnosis not present

## 2022-06-05 DIAGNOSIS — Z111 Encounter for screening for respiratory tuberculosis: Secondary | ICD-10-CM

## 2022-06-05 DIAGNOSIS — R030 Elevated blood-pressure reading, without diagnosis of hypertension: Secondary | ICD-10-CM | POA: Diagnosis not present

## 2022-06-05 DIAGNOSIS — E559 Vitamin D deficiency, unspecified: Secondary | ICD-10-CM | POA: Diagnosis not present

## 2022-06-05 DIAGNOSIS — Z8249 Family history of ischemic heart disease and other diseases of the circulatory system: Secondary | ICD-10-CM

## 2022-06-05 DIAGNOSIS — E538 Deficiency of other specified B group vitamins: Secondary | ICD-10-CM

## 2022-06-05 DIAGNOSIS — Z1329 Encounter for screening for other suspected endocrine disorder: Secondary | ICD-10-CM | POA: Diagnosis not present

## 2022-06-05 DIAGNOSIS — Z0000 Encounter for general adult medical examination without abnormal findings: Secondary | ICD-10-CM | POA: Diagnosis not present

## 2022-06-05 DIAGNOSIS — Z79899 Other long term (current) drug therapy: Secondary | ICD-10-CM

## 2022-06-05 DIAGNOSIS — Z0001 Encounter for general adult medical examination with abnormal findings: Secondary | ICD-10-CM

## 2022-06-05 DIAGNOSIS — Z136 Encounter for screening for cardiovascular disorders: Secondary | ICD-10-CM

## 2022-06-05 DIAGNOSIS — R5383 Other fatigue: Secondary | ICD-10-CM

## 2022-06-05 MED ORDER — ESOMEPRAZOLE MAGNESIUM 40 MG PO CPDR: DELAYED_RELEASE_CAPSULE | ORAL | 3 refills | Status: DC

## 2022-06-05 NOTE — Patient Instructions (Signed)

## 2022-06-06 ENCOUNTER — Other Ambulatory Visit: Payer: Self-pay | Admitting: Internal Medicine

## 2022-06-06 NOTE — Progress Notes (Signed)
<><><><><><><><><><><><><><><><><><><><><><><><><><><><><><><><><> <><><><><><><><><><><><><><><><><><><><><><><><><><><><><><><><><> - Test results slightly outside the reference range are not unusual. If there is anything important, I will review this with you,  otherwise it is considered normal test values.  If you have further questions,  please do not hesitate to contact me at the office or via My Chart.  <><><><><><><><><><><><><><><><><><><><><><><><><><><><><><><><><> <><><><><><><><><><><><><><><><><><><><><><><><><><><><><><><><><>  -  Iron level Normal & OK   - Vitamin B12 = 460  - barely in acceptable range   (Ideal or Goal Vit B12 is between 450 - 1,100)   Low Vit B12 may be associated with Anemia , Fatigue,   Peripheral Neuropathy, Dementia, "Brain Fog", & Depression  - Recommend take a sub-lingual form of Vitamin B12 tablet   1,000 to 5,000 mcg tab that you dissolve under your tongue /Daily   - Can get Baron Sane - best price at [employer] or on [employer] <><><><><><><><><><><><><><><><><><><><><><><><><><><><><><><><><>  -  PSA - Low - No Prostate Cancer - Great     ! <><><><><><><><><><><><><><><><><><><><><><><><><><><><><><><><><>  -  Uric Acid / Gout test is OK <><><><><><><><><><><><><><><><><><><><><><><><><><><><><><><><><>  -  Total  Chol =    176 - [place]            (  Ideal  or  Goal is less than 180  !  )  & -  Bad / Dangerous LDL  Chol =    95   - slightly Elevated              (  Ideal  or  Goal is less than 70  !  )   Excellent   - Very low risk for Heart Attack  / Stroke <><><><><><><><><><><><><><><><><><><><><><><><><><><><><><><><><>  -  A1c - Normal - No   Diabetes  - Great  ! <><><><><><><><><><><><><><><><><><><><><><><><><><><><><><><><><>  - Vitamin D = 30 is EXTREMELY low     !   - Vitamin D goal is between 70-100.  5,000 unit capsules & take 2 capsules = 10,000 units EVERY day  !  - It is very important as a natural  anti-inflammatory and helping the  immune system protect against viral infections, like the Covid-19    helping hair, skin, and nails, as well as reducing stroke and  heart attack risk.   - It helps your bones and helps with mood.  - It also decreases numerous cancer risks so please  take it as directed.   - Low Vit D is associated with a 200-300% higher risk for  CANCER   and 200-300% higher risk for HEART   ATTACK  &  STROKE.    - It is also associated with higher death rate at younger ages,   autoimmune diseases like Rheumatoid arthritis, Lupus,  Multiple Sclerosis.     - Also many other serious conditions, like depression, Alzheimer's  Dementia, infertility, muscle aches, fatigue, fibromyalgia   - just to name a few. <><><><><><><><><><><><><><><><><><><><><><><><><><><><><><><><><>  -  All Else - CBC - Kidneys - Electrolytes - Liver - Magnesium & Thyroid    - all  Normal / OK <><><><><><><><><><><><><><><><><><><><><><><><><><><><><><><><><> <><><><><><><><><><><><><><><><><><><><><><><><><><><><><><><><><>                                                                                                 ]                                                                                                                                                                                                                                                                                                                                                    -  Vitamin D  goal is between 70-100.   - Please make sure that you are taking your Vitamin D as directed.   - It is very important as a natural anti-inflammatory and helping the  immune system protect against viral infections, like the Covid-19    helping hair, skin, and nails, as well as reducing stroke and  heart attack risk.   - It helps your bones and helps with mood.  - It also decreases numerous cancer risks so please  take it as directed.   - Low Vit D is associated with a 200-300% higher risk for  CANCER   and 200-300% higher risk for HEART   ATTACK  &  STROKE.    - It is also associated with higher death rate at younger ages,   autoimmune diseases like Rheumatoid arthritis, Lupus,  Multiple Sclerosis.     - Also many other serious conditions, like depression, Alzheimer's  Dementia, infertility, muscle aches, fatigue, fibromyalgia   - just to name a few. ==========================================================

## 2022-06-08 LAB — INSULIN, RANDOM: Insulin: 14.2 u[IU]/mL

## 2022-06-08 LAB — URINALYSIS, ROUTINE W REFLEX MICROSCOPIC
Bacteria, UA: NONE SEEN /HPF
Bilirubin Urine: NEGATIVE
Glucose, UA: NEGATIVE
Hgb urine dipstick: NEGATIVE
Hyaline Cast: NONE SEEN /LPF
Ketones, ur: NEGATIVE
Nitrite: NEGATIVE
Protein, ur: NEGATIVE
RBC / HPF: NONE SEEN /HPF (ref 0–2)
Specific Gravity, Urine: 1.014 (ref 1.001–1.035)
Squamous Epithelial / HPF: NONE SEEN /HPF (ref <=5)
pH: 6.5 (ref 5.0–8.0)

## 2022-06-08 LAB — MICROALBUMIN / CREATININE URINE RATIO
Creatinine, Urine: 122 mg/dL (ref 20–320)
Microalb Creat Ratio: 5 mcg/mg creat (ref <30)
Microalb, Ur: 0.6 mg/dL

## 2022-06-08 LAB — URIC ACID: Uric Acid, Serum: 4.8 mg/dL (ref 4.0–8.0)

## 2022-06-08 LAB — VITAMIN B12: Vitamin B-12: 460 pg/mL (ref 200–1100)

## 2022-06-08 LAB — HEMOGLOBIN A1C
Hgb A1c MFr Bld: 5.6 % of total Hgb (ref <5.7)
Mean Plasma Glucose: 114 mg/dL
eAG (mmol/L): 6.3 mmol/L

## 2022-06-08 LAB — PSA: PSA: 1.46 ng/mL (ref <=4.00)

## 2022-06-08 LAB — COMPLETE METABOLIC PANEL WITH GFR
AG Ratio: 1.8 (calc) (ref 1.0–2.5)
ALT: 50 U/L — ABNORMAL HIGH (ref 9–46)
AST: 34 U/L (ref 10–35)
Albumin: 5 g/dL (ref 3.6–5.1)
Alkaline phosphatase (APISO): 79 U/L (ref 35–144)
BUN/Creatinine Ratio: 14 (calc) (ref 6–22)
BUN: 19 mg/dL (ref 7–25)
CO2: 27 mmol/L (ref 20–32)
Calcium: 9.8 mg/dL (ref 8.6–10.3)
Chloride: 101 mmol/L (ref 98–110)
Creat: 1.38 mg/dL — ABNORMAL HIGH (ref 0.70–1.30)
Globulin: 2.8 g/dL (calc) (ref 1.9–3.7)
Glucose, Bld: 91 mg/dL (ref 65–99)
Potassium: 4.1 mmol/L (ref 3.5–5.3)
Sodium: 137 mmol/L (ref 135–146)
Total Bilirubin: 0.6 mg/dL (ref 0.2–1.2)
Total Protein: 7.8 g/dL (ref 6.1–8.1)
eGFR: 60 mL/min/{1.73_m2} (ref >=60)

## 2022-06-08 LAB — IRON, TOTAL/TOTAL IRON BINDING CAP
%SAT: 31 % (calc) (ref 20–48)
Iron: 106 ug/dL (ref 50–180)
TIBC: 345 mcg/dL (calc) (ref 250–425)

## 2022-06-08 LAB — CBC WITH DIFFERENTIAL/PLATELET
Absolute Monocytes: 431 cells/uL (ref 200–950)
Basophils Absolute: 39 cells/uL (ref 0–200)
Basophils Relative: 0.8 %
Eosinophils Absolute: 39 cells/uL (ref 15–500)
Eosinophils Relative: 0.8 %
HCT: 50.5 % — ABNORMAL HIGH (ref 38.5–50.0)
Hemoglobin: 17.4 g/dL — ABNORMAL HIGH (ref 13.2–17.1)
Lymphs Abs: 1338 cells/uL (ref 850–3900)
MCH: 32.5 pg (ref 27.0–33.0)
MCHC: 34.5 g/dL (ref 32.0–36.0)
MCV: 94.4 fL (ref 80.0–100.0)
MPV: 10.7 fL (ref 7.5–12.5)
Monocytes Relative: 8.8 %
Neutro Abs: 3053 cells/uL (ref 1500–7800)
Neutrophils Relative %: 62.3 %
Platelets: 201 10*3/uL (ref 140–400)
RBC: 5.35 10*6/uL (ref 4.20–5.80)
RDW: 12.5 % (ref 11.0–15.0)
Total Lymphocyte: 27.3 %
WBC: 4.9 10*3/uL (ref 3.8–10.8)

## 2022-06-08 LAB — LIPID PANEL
Cholesterol: 176 mg/dL (ref <200)
HDL: 56 mg/dL (ref >=40)
LDL Cholesterol (Calc): 95 mg/dL (calc)
Non-HDL Cholesterol (Calc): 120 mg/dL (calc) (ref <130)
Total CHOL/HDL Ratio: 3.1 (calc) (ref <5.0)
Triglycerides: 146 mg/dL (ref <150)

## 2022-06-08 LAB — VITAMIN D 25 HYDROXY (VIT D DEFICIENCY, FRACTURES): Vit D, 25-Hydroxy: 30 ng/mL (ref 30–100)

## 2022-06-08 LAB — TSH: TSH: 0.82 mIU/L (ref 0.40–4.50)

## 2022-06-08 LAB — TESTOSTERONE: Testosterone: 777 ng/dL (ref 250–827)

## 2022-06-08 LAB — MAGNESIUM: Magnesium: 2.5 mg/dL (ref 1.5–2.5)

## 2022-06-08 LAB — MICROSCOPIC MESSAGE

## 2022-06-11 ENCOUNTER — Other Ambulatory Visit: Payer: Self-pay | Admitting: Internal Medicine

## 2022-08-05 ENCOUNTER — Other Ambulatory Visit: Payer: Self-pay | Admitting: Internal Medicine

## 2022-08-05 DIAGNOSIS — F411 Generalized anxiety disorder: Secondary | ICD-10-CM

## 2022-08-06 ENCOUNTER — Other Ambulatory Visit: Payer: Self-pay | Admitting: Internal Medicine

## 2022-08-06 DIAGNOSIS — F411 Generalized anxiety disorder: Secondary | ICD-10-CM

## 2022-08-06 NOTE — Telephone Encounter (Signed)
Can you please discuss with patient that Lexapro has dose limit of 20 mg daily.  Would he like to decrease dosage?  If not will need to schedule an appointment to discuss changing medication.

## 2022-08-07 ENCOUNTER — Other Ambulatory Visit: Payer: Self-pay

## 2022-08-07 DIAGNOSIS — E782 Mixed hyperlipidemia: Secondary | ICD-10-CM

## 2022-08-07 DIAGNOSIS — M1A0710 Idiopathic chronic gout, right ankle and foot, without tophus (tophi): Secondary | ICD-10-CM

## 2022-08-07 MED ORDER — ALLOPURINOL 300 MG PO TABS: ORAL_TABLET | ORAL | 3 refills | Status: DC

## 2022-08-07 MED ORDER — ROSUVASTATIN CALCIUM 20 MG PO TABS: ORAL_TABLET | ORAL | 3 refills | Status: AC

## 2022-08-17 ENCOUNTER — Other Ambulatory Visit: Payer: Self-pay

## 2022-08-17 DIAGNOSIS — M1A0710 Idiopathic chronic gout, right ankle and foot, without tophus (tophi): Secondary | ICD-10-CM

## 2022-08-17 MED ORDER — ALLOPURINOL 300 MG PO TABS: ORAL_TABLET | ORAL | 3 refills | Status: DC

## 2022-09-09 ENCOUNTER — Other Ambulatory Visit: Payer: Self-pay | Admitting: Internal Medicine

## 2022-09-10 ENCOUNTER — Other Ambulatory Visit: Payer: Self-pay | Admitting: Nurse Practitioner

## 2022-09-10 MED ORDER — PHENTERMINE HCL 37.5 MG PO TABS: ORAL_TABLET | ORAL | 0 refills | Status: DC

## 2022-09-11 ENCOUNTER — Ambulatory Visit: Payer: Managed Care, Other (non HMO) | Admitting: Nurse Practitioner

## 2022-11-11 ENCOUNTER — Other Ambulatory Visit: Payer: Self-pay | Admitting: Internal Medicine

## 2022-11-11 DIAGNOSIS — F411 Generalized anxiety disorder: Secondary | ICD-10-CM

## 2022-12-11 ENCOUNTER — Other Ambulatory Visit: Payer: Self-pay | Admitting: Nurse Practitioner

## 2022-12-11 ENCOUNTER — Ambulatory Visit: Payer: Managed Care, Other (non HMO) | Admitting: Internal Medicine

## 2023-01-08 ENCOUNTER — Other Ambulatory Visit: Payer: Self-pay | Admitting: Internal Medicine

## 2023-01-08 DIAGNOSIS — F411 Generalized anxiety disorder: Secondary | ICD-10-CM

## 2023-01-08 DIAGNOSIS — N3281 Overactive bladder: Secondary | ICD-10-CM

## 2023-01-08 MED ORDER — BUPROPION HCL ER (XL) 300 MG PO TB24
ORAL_TABLET | ORAL | 3 refills | Status: AC
Start: 2023-01-08 — End: ?

## 2023-01-08 MED ORDER — OXYBUTYNIN CHLORIDE ER 10 MG PO TB24
ORAL_TABLET | ORAL | 3 refills | Status: AC
Start: 2023-01-08 — End: ?

## 2023-01-08 MED ORDER — ESCITALOPRAM OXALATE 20 MG PO TABS
ORAL_TABLET | ORAL | 3 refills | Status: AC
Start: 2023-01-08 — End: ?

## 2023-01-08 MED ORDER — ESCITALOPRAM OXALATE 20 MG PO TABS: ORAL_TABLET | ORAL | 3 refills | Status: DC

## 2023-06-02 NOTE — Progress Notes (Deleted)
 Assessment and Plan:  Alexander Logan was seen today for a ***.  Diagnoses and all order for this visit:  There are no diagnoses linked to this encounter.   Continue to monitor for any increase in fever, chills, N/V, diarrhea, changes to bowel habits, blood in stool.  Notify office for further evaluation and treatment, questions or concerns if s/s fail to improve. The risks and benefits of my recommendations, as well as other treatment options were discussed with the patient today. Questions were answered.  Further disposition pending results of labs. Discussed med's effects and SE's.    Over *** minutes of exam, counseling, chart review, and critical decision making was performed.   Future Appointments  Date Time Provider Department Center  06/03/2023  9:45 AM Alexander President, NP [facility] None  06/08/2023 11:00 AM Alexander Fallow, MD [facility] None    ------------------------------------------------------------------------------------------------------------------   HPI There were no vitals taken for this visit.  Upper Respiratory Infection Patient complains of {uri rfv:14243::symptoms of a URI}. Symptoms include {uri sx:15453}. Onset of symptoms was {1-10:13787} {time; units:18646::days} ago, and has been {clinical course:17::gradually worsening} since that time. Treatment to date: {URI tx:14268}.          Past Medical History:  Diagnosis Date   Gout      No Known Allergies  Current Outpatient Medications on File Prior to Visit  Medication Sig   acetaminophen -codeine  (TYLENOL  #3) 300-30 MG tablet Take 1-2 tablets by mouth every 4 (four) hours as needed for moderate pain.   albuterol  (PROAIR  HFA) 108 (90 Base) MCG/ACT inhaler Inhale 1-2 puffs into the lungs every 6 (six) hours as needed for wheezing or shortness of breath.   allopurinol  (ZYLOPRIM ) 300 MG tablet Take 1 tablet Daily to Prevent Gout   ALPRAZolam  (XANAX ) 0.5 MG tablet Take  1/2 to 1 tablet  2 to 3 x  /day  as needed for Anxiety   baclofen  (LIORESAL ) 10 MG tablet TAKE 1/2 TO 1 TABLET AT BEDTIME IF NEEDED FOR MUSCLESPASM   buPROPion  (WELLBUTRIN  XL) 300 MG 24 hr tablet Take 1 tablet every Morning  for Mood, Focus & Concentration.   escitalopram  (LEXAPRO ) 20 MG tablet Take  1 tablet  Daily  for Mood   esomeprazole  (NEXIUM ) 40 MG capsule Take  1 capsule  Daily  to Prevent Heartburn & Indigestion   guaiFENesin (MUCINEX PO) Take by mouth.   IBUPROFEN PO Take by mouth.   indomethacin  (INDOCIN ) 50 MG capsule TAKE 1 CAPSULE 3 X /DAY WITH MEALS IF NEEDED FOR GOUT PAIN   oxybutynin  (DITROPAN  XL) 10 MG 24 hr tablet Take  1 tablet  every Morning  for Overactive Bladder (OAB)   phentermine  (ADIPEX-P ) 37.5 MG tablet TAKE 1/2 TO 1 TABLET EVERY MORNING FOR DIETING & WEIGHT LOSS STRENGTH: 37.5 MG   Pseudoephedrine HCl (SUDAFED PO) Take by mouth.   rosuvastatin  (CRESTOR ) 20 MG tablet TAKE 1 TABLET BY MOUTH EVERY DAY FOR CHOLESTEROL   topiramate  (TOPAMAX ) 50 MG tablet TAKE 1/2 TO 1 TABLET 2 X /DAY AT SUPPERTIME & BEDTIME FOR DIETING & WEIGHT LOSS   No current facility-administered medications on file prior to visit.    ROS: all negative except what is noted in the HPI.   Physical Exam:  There were no vitals taken for this visit.  General Appearance: NAD.  Awake, conversant and cooperative. Eyes: PERRLA, EOMs intact.  Sclera white.  Conjunctiva without erythema. Sinuses: No frontal/maxillary tenderness.  No nasal discharge. Nares patent.  ENT/Mouth: Ext aud canals clear.  Bilateral TMs w/DOL and without erythema or bulging. Hearing intact.  Posterior pharynx without swelling or exudate.  Tonsils without swelling or erythema.  Neck: Supple.  No masses, nodules or thyromegaly. Respiratory: Effort is regular with non-labored breathing. Breath sounds are equal bilaterally without rales, rhonchi, wheezing or stridor.  Cardio: RRR with no MRGs. Brisk peripheral pulses without edema.  Abdomen: Active BS in all  four quadrants.  Soft and non-tender without guarding, rebound tenderness, hernias or masses. Lymphatics: Non tender without lymphadenopathy.  Musculoskeletal: Full ROM, 5/5 strength, normal ambulation.  No clubbing or cyanosis. Skin: Appropriate color for ethnicity. Warm without rashes, lesions, ecchymosis, ulcers.  Neuro: CN II-XII grossly normal. Normal muscle tone without cerebellar symptoms and intact sensation.   Psych: AO X 3,  appropriate mood and affect, insight and judgment.     BASCOM NECESSARY, NP 10:48 PM [facility]

## 2023-06-03 ENCOUNTER — Ambulatory Visit
Admission: RE | Admit: 2023-06-03 | Discharge: 2023-06-03 | Disposition: A | Discharge status: Home / Self Care | Payer: Managed Care, Other (non HMO) | Source: Ambulatory Visit | Attending: Nurse Practitioner | Admitting: Nurse Practitioner

## 2023-06-03 ENCOUNTER — Ambulatory Visit (INDEPENDENT_AMBULATORY_CARE_PROVIDER_SITE_OTHER): Payer: Managed Care, Other (non HMO) | Admitting: Nurse Practitioner

## 2023-06-03 ENCOUNTER — Other Ambulatory Visit: Payer: Self-pay

## 2023-06-03 ENCOUNTER — Encounter: Payer: Self-pay | Admitting: Nurse Practitioner

## 2023-06-03 ENCOUNTER — Ambulatory Visit: Payer: Managed Care, Other (non HMO) | Admitting: Nurse Practitioner

## 2023-06-03 VITALS — BP 136/70 | HR 95 | Temp 97.9°F | Resp 17 | Ht 67.5 in | Wt 192.2 lb

## 2023-06-03 DIAGNOSIS — R059 Cough, unspecified: Secondary | ICD-10-CM

## 2023-06-03 DIAGNOSIS — R030 Elevated blood-pressure reading, without diagnosis of hypertension: Secondary | ICD-10-CM

## 2023-06-03 DIAGNOSIS — E66812 Obesity, class 2: Secondary | ICD-10-CM | POA: Diagnosis not present

## 2023-06-03 DIAGNOSIS — J40 Bronchitis, not specified as acute or chronic: Secondary | ICD-10-CM

## 2023-06-03 MED ORDER — ACETAMINOPHEN-CODEINE 300-30 MG PO TABS: 1.0000 | ORAL_TABLET | ORAL | 0 refills | Status: AC | PRN

## 2023-06-03 MED ORDER — PREDNISONE 20 MG PO TABS: ORAL_TABLET | ORAL | 1 refills | Status: DC

## 2023-06-03 MED ORDER — PROMETHAZINE-DM 6.25-15 MG/5ML PO SYRP: 5.0000 mL | ORAL_SOLUTION | Freq: Four times a day (QID) | ORAL | 1 refills | Status: DC | PRN

## 2023-06-03 MED ORDER — AZITHROMYCIN 250 MG PO TABS
ORAL_TABLET | ORAL | 1 refills | Status: AC
Start: 2023-06-03 — End: ?

## 2023-06-03 MED ORDER — IPRATROPIUM-ALBUTEROL 0.5-2.5 (3) MG/3ML IN SOLN
3.0000 mL | RESPIRATORY_TRACT | 0 refills | Status: DC | PRN
Start: 2023-06-03 — End: 2023-06-09

## 2023-06-03 MED ORDER — BENZONATATE 100 MG PO CAPS: 100.0000 mg | ORAL_CAPSULE | Freq: Four times a day (QID) | ORAL | 1 refills | Status: DC | PRN

## 2023-06-03 NOTE — Patient Instructions (Addendum)
 Acute Bronchitis, Adult  Push fluids Mucinex to thin mucus secretions Vit C, Vit D and zinc to build immune system Start medications and if chest xray reveal pneumonia will add a second antibiotic If feels he can not take a breath or hurts to breathe or pulse O2 not staying above 88 go to the ER -     azithromycin  (ZITHROMAX ) 250 MG tablet; Take 2 tablets (500 mg) on  Day 1,  followed by 1 tablet (250 mg) once daily on Days 2 through 5. -     predniSONE  (DELTASONE ) 20 MG tablet; 3 tablets daily with food for 3 days, 2 tabs daily for 3 days, 1 tab a day for 5 days. -     promethazine -dextromethorphan (PROMETHAZINE -DM) 6.25-15 MG/5ML syrup; Take 5 mLs by mouth 4 (four) times daily as needed for cough. -     benzonatate  (TESSALON  PERLES) 100 MG capsule; Take 1 capsule (100 mg total) by mouth every 6 (six) hours as needed for cough. -     ipratropium-albuterol  (DUONEB) 0.5-2.5 (3) MG/3ML SOLN; Take 3 mLs by nebulization every 4 (four) hours as needed. Max:6 doses per day -     acetaminophen -codeine  (TYLENOL  #3) 300-30 MG tablet; Take 1-2 tablets by mouth every 4 (four) hours as needed for moderate pain (pain score 4-6).  Acute bronchitis is sudden inflammation of the main airways (bronchi) that come off the windpipe (trachea) in the lungs. The swelling causes the airways to get smaller and make more mucus than normal. This can make it hard to breathe and can cause coughing or noisy breathing (wheezing). Acute bronchitis may last several weeks. The cough may last longer. Allergies, asthma, and exposure to smoke may make the condition worse. What are the causes? This condition can be caused by germs and by substances that irritate the lungs, including: Cold and flu viruses. The most common cause of this condition is the virus that causes the common cold. Bacteria. This is less common. Breathing in substances that irritate the lungs, including: Smoke from cigarettes and other forms of tobacco. Dust  and pollen. Fumes from household cleaning products, gases, or burned fuel. Indoor or outdoor air pollution. What increases the risk? The following factors may make you more likely to develop this condition: A weak body's defense system, also called the immune system. A condition that affects your lungs and breathing, such as asthma. What are the signs or symptoms? Common symptoms of this condition include: Coughing. This may bring up clear, yellow, or green mucus from your lungs (sputum). Wheezing. Runny or stuffy nose. Having too much mucus in your lungs (chest congestion). Shortness of breath. Aches and pains, including sore throat or chest. How is this diagnosed? This condition is usually diagnosed based on: Your symptoms and medical history. A physical exam. You may also have other tests, including tests to rule out other conditions, such as pneumonia. These tests include: A test of lung function. Test of a mucus sample to look for the presence of bacteria. Tests to check the oxygen level in your blood. Blood tests. Chest X-ray. How is this treated? Most cases of acute bronchitis clear up over time without treatment. Your health care provider may recommend: Drinking more fluids to help thin your mucus so it is easier to cough up. Taking inhaled medicine (inhaler) to improve air flow in and out of your lungs. Using a vaporizer or a humidifier. These are machines that add water to the air to help you breathe better. Taking  a medicine that thins mucus and clears congestion (expectorant). Taking a medicine that prevents or stops coughing (cough suppressant). It is not common to take an antibiotic medicine for this condition. Follow these instructions at home:  Take over-the-counter and prescription medicines only as told by your health care provider. Use an inhaler, vaporizer, or humidifier as told by your health care provider. Take two teaspoons (10 mL) of honey at bedtime to  lessen coughing at night. Drink enough fluid to keep your urine pale yellow. Do not use any products that contain nicotine or tobacco. These products include cigarettes, chewing tobacco, and vaping devices, such as e-cigarettes. If you need help quitting, ask your health care provider. Get plenty of rest. Return to your normal activities as told by your health care provider. Ask your health care provider what activities are safe for you. Keep all follow-up visits. This is important. How is this prevented? To lower your risk of getting this condition again: Wash your hands often with soap and water for at least 20 seconds. If soap and water are not available, use hand sanitizer. Avoid contact with people who have cold symptoms. Try not to touch your mouth, nose, or eyes with your hands. Avoid breathing in smoke or chemical fumes. Breathing smoke or chemical fumes will make your condition worse. Get the flu shot every year. Contact a health care provider if: Your symptoms do not improve after 2 weeks. You have trouble coughing up the mucus. Your cough keeps you awake at night. You have a fever. Get help right away if you: Cough up blood. Feel pain in your chest. Have severe shortness of breath. Faint or keep feeling like you are going to faint. Have a severe headache. Have a fever or chills that get worse. These symptoms may represent a serious problem that is an emergency. Do not wait to see if the symptoms will go away. Get medical help right away. Call your local emergency services (911 in the [place] Do not drive yourself to the hospital. Summary Acute bronchitis is inflammation of the main airways (bronchi) that come off the windpipe (trachea) in the lungs. The swelling causes the airways to get smaller and make more mucus than normal. Drinking more fluids can help thin your mucus so it is easier to cough up. Take over-the-counter and prescription medicines only as told by your health  care provider. Do not use any products that contain nicotine or tobacco. These products include cigarettes, chewing tobacco, and vaping devices, such as e-cigarettes. If you need help quitting, ask your health care provider. Contact a health care provider if your symptoms do not improve after 2 weeks. This information is not intended to replace advice given to you by your health care provider. Make sure you discuss any questions you have with your health care provider. Document Revised: 08/21/2021 Document Reviewed: 09/11/2020 Elsevier Patient Education  2024 [employer].

## 2023-06-03 NOTE — Progress Notes (Signed)
 Assessment and Plan:  Alexander Logan was seen today for uri.  Diagnoses and all orders for this visit:  Elevated BP without diagnosis of hypertension - continue DASH diet, exercise and monitor at home. Call if greater than 130/80.      Bronchitis Push fluids Mucinex to thin mucus secretions Vit C, Vit D and zinc to build immune system Start medications and if chest xray reveal pneumonia will add a second antibiotic If feels he can not take a breath or hurts to breathe or pulse O2 not staying above 88 go to the ER -     azithromycin  (ZITHROMAX ) 250 MG tablet; Take 2 tablets (500 mg) on  Day 1,  followed by 1 tablet (250 mg) once daily on Days 2 through 5. -     predniSONE  (DELTASONE ) 20 MG tablet; 3 tablets daily with food for 3 days, 2 tabs daily for 3 days, 1 tab a day for 5 days. -     promethazine -dextromethorphan (PROMETHAZINE -DM) 6.25-15 MG/5ML syrup; Take 5 mLs by mouth 4 (four) times daily as needed for cough. -     benzonatate  (TESSALON  PERLES) 100 MG capsule; Take 1 capsule (100 mg total) by mouth every 6 (six) hours as needed for cough. -     ipratropium-albuterol  (DUONEB) 0.5-2.5 (3) MG/3ML SOLN; Take 3 mLs by nebulization every 4 (four) hours as needed. Max:6 doses per day -     acetaminophen -codeine  (TYLENOL  #3) 300-30 MG tablet; Take 1-2 tablets by mouth every 4 (four) hours as needed for moderate pain (pain score 4-6).  Cough, unspecified type -     DG Chest 2 View; Future       Further disposition pending results of labs. Discussed med's effects and SE's.   Over 30 minutes of exam, counseling, chart review, and critical decision making was performed.   Future Appointments  Date Time Provider Department Center  06/08/2023 11:00 AM Tonita Fallow, MD [facility] None  06/21/2024 11:00 AM Tonita Fallow, MD [facility] None    ------------------------------------------------------------------------------------------------------------------   HPI BP 136/70   Pulse 95    Temp 97.9 F (36.6 C)   Resp 17   Ht 5' 7.5 (1.715 m)   Wt 192 lb 3.2 oz (87.2 kg)   SpO2 97%   BMI 29.66 kg/m   57 y.o.male presents for productive cough of green mucus. Cannot get nasal drainage. Does have sinus pressure, sore throat, hoarseness. Denies nausea, vomiting, fever and diarrhea. He has used Sudafed, Mucinex and Albuterol  inhaler.  Symptoms present for approximately 2 weeks   BP well controlled without medication BP Readings from Last 3 Encounters:  06/03/23 136/70  06/05/22 130/80  12/05/21 140/80  Denies headaches, chest pain, shortness of breath and dizziness   BMI is Body mass index is 29.66 kg/m., he has not been working on diet and exercise. Wt Readings from Last 3 Encounters:  06/03/23 192 lb 3.2 oz (87.2 kg)  06/05/22 187 lb (84.8 kg)  12/05/21 180 lb 9.6 oz (81.9 kg)     Past Medical History:  Diagnosis Date   Gout      No Known Allergies  Current Outpatient Medications on File Prior to Visit  Medication Sig   acetaminophen -codeine  (TYLENOL  #3) 300-30 MG tablet Take 1-2 tablets by mouth every 4 (four) hours as needed for moderate pain.   albuterol  (PROAIR  HFA) 108 (90 Base) MCG/ACT inhaler Inhale 1-2 puffs into the lungs every 6 (six) hours as needed for wheezing or shortness of breath.   allopurinol  (ZYLOPRIM )  300 MG tablet Take 1 tablet Daily to Prevent Gout   ALPRAZolam  (XANAX ) 0.5 MG tablet Take  1/2 to 1 tablet  2 to 3 x /day  as needed for Anxiety   baclofen  (LIORESAL ) 10 MG tablet TAKE 1/2 TO 1 TABLET AT BEDTIME IF NEEDED FOR MUSCLESPASM   buPROPion  (WELLBUTRIN  XL) 300 MG 24 hr tablet Take 1 tablet every Morning  for Mood, Focus & Concentration.   escitalopram  (LEXAPRO ) 20 MG tablet Take  1 tablet  Daily  for Mood   esomeprazole  (NEXIUM ) 40 MG capsule Take  1 capsule  Daily  to Prevent Heartburn & Indigestion   guaiFENesin (MUCINEX PO) Take by mouth.   IBUPROFEN PO Take by mouth.   indomethacin  (INDOCIN ) 50 MG capsule TAKE 1 CAPSULE 3 X  /DAY WITH MEALS IF NEEDED FOR GOUT PAIN   oxybutynin  (DITROPAN  XL) 10 MG 24 hr tablet Take  1 tablet  every Morning  for Overactive Bladder (OAB)   phentermine  (ADIPEX-P ) 37.5 MG tablet TAKE 1/2 TO 1 TABLET EVERY MORNING FOR DIETING & WEIGHT LOSS STRENGTH: 37.5 MG   Pseudoephedrine HCl (SUDAFED PO) Take by mouth.   rosuvastatin  (CRESTOR ) 20 MG tablet TAKE 1 TABLET BY MOUTH EVERY DAY FOR CHOLESTEROL   topiramate  (TOPAMAX ) 50 MG tablet TAKE 1/2 TO 1 TABLET 2 X /DAY AT SUPPERTIME & BEDTIME FOR DIETING & WEIGHT LOSS   No current facility-administered medications on file prior to visit.    ROS: all negative except above.   Physical Exam:  BP 136/70   Pulse 95   Temp 97.9 F (36.6 C)   Resp 17   Ht 5' 7.5 (1.715 m)   Wt 192 lb 3.2 oz (87.2 kg)   SpO2 97%   BMI 29.66 kg/m   General Appearance: Pleasant male ill appearing with persistent cough throughout the visit Eyes: PERRLA, EOMs, conjunctiva no swelling or erythema Sinuses: No Frontal/maxillary tenderness ENT/Mouth: Ext aud canals clear, TMs without erythema, bulging. No erythema, swelling, or exudate on post pharynx.  Tonsils not swollen or erythematous. Hearing normal.  Neck: Supple, thyroid normal.  Respiratory: Respiratory effort normal,LLQ crackles, Expiratory wheezing upper lungs Cardio: RRR with no MRGs. Brisk peripheral pulses without edema.  Abdomen: Soft, + BS.  Non tender, no guarding, rebound, hernias, masses. Lymphatics: Non tender without lymphadenopathy.  Musculoskeletal: Full ROM, 5/5 strength, normal gait.  Skin: Warm, dry without rashes, lesions, ecchymosis.  Neuro: Cranial nerves intact. Normal muscle tone, no cerebellar symptoms. Sensation intact.  Psych: Awake and oriented X 3, normal affect, Insight and Judgment appropriate.     Audree Schrecengost E Khalel Alms, NP 1:45 PM [facility]

## 2023-06-08 ENCOUNTER — Encounter: Payer: Managed Care, Other (non HMO) | Admitting: Internal Medicine

## 2023-06-08 ENCOUNTER — Other Ambulatory Visit: Payer: Self-pay | Admitting: Internal Medicine

## 2023-06-08 ENCOUNTER — Other Ambulatory Visit: Payer: Self-pay | Admitting: Nurse Practitioner

## 2023-06-08 DIAGNOSIS — K219 Gastro-esophageal reflux disease without esophagitis: Secondary | ICD-10-CM

## 2023-06-08 DIAGNOSIS — F411 Generalized anxiety disorder: Secondary | ICD-10-CM

## 2023-06-09 ENCOUNTER — Other Ambulatory Visit: Payer: Self-pay | Admitting: Internal Medicine

## 2023-07-01 ENCOUNTER — Other Ambulatory Visit: Payer: Self-pay

## 2023-07-01 DIAGNOSIS — E782 Mixed hyperlipidemia: Secondary | ICD-10-CM

## 2023-07-05 ENCOUNTER — Other Ambulatory Visit: Payer: Self-pay | Admitting: Nurse Practitioner

## 2023-07-05 ENCOUNTER — Encounter: Payer: Self-pay | Admitting: Internal Medicine

## 2023-07-05 DIAGNOSIS — E782 Mixed hyperlipidemia: Secondary | ICD-10-CM

## 2023-07-05 DIAGNOSIS — E66812 Obesity, class 2: Secondary | ICD-10-CM

## 2023-07-05 MED ORDER — PHENTERMINE HCL 37.5 MG PO TABS: ORAL_TABLET | ORAL | 0 refills | Status: AC

## 2023-07-08 ENCOUNTER — Encounter: Payer: Managed Care, Other (non HMO) | Admitting: Internal Medicine

## 2023-08-03 ENCOUNTER — Other Ambulatory Visit: Payer: Self-pay

## 2023-08-03 DIAGNOSIS — J111 Influenza due to unidentified influenza virus with other respiratory manifestations: Secondary | ICD-10-CM

## 2023-08-03 DIAGNOSIS — J40 Bronchitis, not specified as acute or chronic: Secondary | ICD-10-CM

## 2023-08-03 DIAGNOSIS — F411 Generalized anxiety disorder: Secondary | ICD-10-CM

## 2023-08-03 DIAGNOSIS — M1A0710 Idiopathic chronic gout, right ankle and foot, without tophus (tophi): Secondary | ICD-10-CM

## 2023-08-03 MED ORDER — ALLOPURINOL 300 MG PO TABS: ORAL_TABLET | ORAL | 0 refills | Status: AC

## 2023-08-03 MED ORDER — ALPRAZOLAM 0.5 MG PO TABS
ORAL_TABLET | ORAL | 0 refills | Status: AC
Start: 2023-08-03 — End: ?

## 2023-08-03 MED ORDER — ALBUTEROL SULFATE HFA 108 (90 BASE) MCG/ACT IN AERS: 1.0000 | INHALATION_SPRAY | Freq: Four times a day (QID) | RESPIRATORY_TRACT | 2 refills | Status: AC | PRN

## 2023-08-03 MED ORDER — IPRATROPIUM-ALBUTEROL 0.5-2.5 (3) MG/3ML IN SOLN: 3.0000 mL | RESPIRATORY_TRACT | 0 refills | Status: AC | PRN

## 2023-09-05 ENCOUNTER — Other Ambulatory Visit: Payer: Self-pay | Admitting: Family

## 2023-09-05 DIAGNOSIS — J40 Bronchitis, not specified as acute or chronic: Secondary | ICD-10-CM

## 2024-06-07 ENCOUNTER — Encounter: Payer: Managed Care, Other (non HMO) | Admitting: Internal Medicine

## 2024-06-21 ENCOUNTER — Encounter: Payer: Managed Care, Other (non HMO) | Admitting: Internal Medicine

## 2024-07-17 ENCOUNTER — Encounter: Payer: Managed Care, Other (non HMO) | Admitting: Internal Medicine
